# Patient Record
Sex: Male | Born: 1960 | Race: White | Hispanic: No | Marital: Married | Smoking: Never smoker
Health system: Southern US, Community
[De-identification: ages and names within clinical notes are randomized; demographics above are authoritative.]

## PROBLEM LIST (undated history)

## (undated) DIAGNOSIS — G4730 Sleep apnea, unspecified: Secondary | ICD-10-CM

## (undated) DIAGNOSIS — R0902 Hypoxemia: Secondary | ICD-10-CM

## (undated) DIAGNOSIS — K219 Gastro-esophageal reflux disease without esophagitis: Secondary | ICD-10-CM

## (undated) DIAGNOSIS — F419 Anxiety disorder, unspecified: Secondary | ICD-10-CM

## (undated) DIAGNOSIS — T7840XA Allergy, unspecified, initial encounter: Secondary | ICD-10-CM

## (undated) DIAGNOSIS — Z789 Other specified health status: Secondary | ICD-10-CM

## (undated) HISTORY — DX: Hypoxemia: R09.02

## (undated) HISTORY — PX: FRACTURE SURGERY: SHX138

## (undated) HISTORY — DX: Allergy, unspecified, initial encounter: T78.40XA

## (undated) HISTORY — DX: Anxiety disorder, unspecified: F41.9

## (undated) HISTORY — DX: Sleep apnea, unspecified: G47.30

## (undated) HISTORY — DX: Gastro-esophageal reflux disease without esophagitis: K21.9

## (undated) HISTORY — DX: Other specified health status: Z78.9

---

## 1992-06-18 HISTORY — PX: KNEE ARTHROSCOPY: SUR90

## 2010-02-13 ENCOUNTER — Emergency Department: Payer: Self-pay | Admitting: Emergency Medicine

## 2011-06-14 ENCOUNTER — Emergency Department: Payer: Self-pay | Admitting: *Deleted

## 2011-06-14 ENCOUNTER — Ambulatory Visit: Payer: Self-pay

## 2013-12-23 ENCOUNTER — Observation Stay: Payer: Self-pay | Admitting: Internal Medicine

## 2013-12-23 LAB — CK TOTAL AND CKMB (NOT AT ARMC)
CK, TOTAL: 83 U/L
CK, Total: 126 U/L
CK, Total: 74 U/L
CK-MB: 3.8 ng/mL — ABNORMAL HIGH (ref 0.5–3.6)
CK-MB: 2.7 ng/mL (ref 0.5–3.6)
CK-MB: 2.4 ng/mL (ref 0.5–3.6)

## 2013-12-23 LAB — CBC
HCT: 45.4 % (ref 40.0–52.0)
HGB: 15.3 g/dL (ref 13.0–18.0)
MCH: 33 pg (ref 26.0–34.0)
MCHC: 33.7 g/dL (ref 32.0–36.0)
MCV: 98 fL (ref 80–100)
Platelet: 233 10*3/uL (ref 150–440)
RBC: 4.65 10*6/uL (ref 4.40–5.90)
RDW: 12.8 % (ref 11.5–14.5)
WBC: 5 10*3/uL (ref 3.8–10.6)

## 2013-12-23 LAB — LIPID PANEL
Cholesterol: 179 mg/dL (ref 0–200)
HDL Cholesterol: 52 mg/dL (ref 40–60)
Ldl Cholesterol, Calc: 111 mg/dL — ABNORMAL HIGH (ref 0–100)
TRIGLYCERIDES: 79 mg/dL (ref 0–200)
VLDL CHOLESTEROL, CALC: 16 mg/dL (ref 5–40)

## 2013-12-23 LAB — BASIC METABOLIC PANEL
ANION GAP: 10 (ref 7–16)
BUN: 10 mg/dL (ref 7–18)
CALCIUM: 8.7 mg/dL (ref 8.5–10.1)
CO2: 25 mmol/L (ref 21–32)
Chloride: 106 mmol/L (ref 98–107)
Creatinine: 0.91 mg/dL (ref 0.60–1.30)
EGFR (African American): >60
EGFR (Non-African Amer.): >60
GLUCOSE: 113 mg/dL — AB (ref 65–99)
OSMOLALITY: 281 (ref 275–301)
Potassium: 4.1 mmol/L (ref 3.5–5.1)
SODIUM: 141 mmol/L (ref 136–145)

## 2013-12-23 LAB — TROPONIN I: Troponin-I: <0.02 ng/mL

## 2013-12-24 LAB — BASIC METABOLIC PANEL
Anion Gap: 8 (ref 7–16)
BUN: 11 mg/dL (ref 7–18)
CALCIUM: 8.3 mg/dL — AB (ref 8.5–10.1)
CHLORIDE: 107 mmol/L (ref 98–107)
CREATININE: 0.99 mg/dL (ref 0.60–1.30)
Co2: 26 mmol/L (ref 21–32)
EGFR (African American): >60
EGFR (Non-African Amer.): >60
GLUCOSE: 104 mg/dL — AB (ref 65–99)
Osmolality: 281 (ref 275–301)
POTASSIUM: 3.8 mmol/L (ref 3.5–5.1)
Sodium: 141 mmol/L (ref 136–145)

## 2013-12-24 LAB — CBC WITH DIFFERENTIAL/PLATELET
Basophil #: 0 10*3/uL (ref 0.0–0.1)
Basophil %: 1 %
Eosinophil #: 0.1 10*3/uL (ref 0.0–0.7)
Eosinophil %: 2.9 %
HCT: 42.1 % (ref 40.0–52.0)
HGB: 14.4 g/dL (ref 13.0–18.0)
LYMPHS ABS: 1.5 10*3/uL (ref 1.0–3.6)
LYMPHS PCT: 33.7 %
MCH: 33.6 pg (ref 26.0–34.0)
MCHC: 34.2 g/dL (ref 32.0–36.0)
MCV: 99 fL (ref 80–100)
Monocyte #: 0.3 x10 3/mm (ref 0.2–1.0)
Monocyte %: 7.6 %
NEUTROS PCT: 54.8 %
Neutrophil #: 2.5 10*3/uL (ref 1.4–6.5)
PLATELETS: 217 10*3/uL (ref 150–440)
RBC: 4.27 10*6/uL — ABNORMAL LOW (ref 4.40–5.90)
RDW: 12.9 % (ref 11.5–14.5)
WBC: 4.5 10*3/uL (ref 3.8–10.6)

## 2014-10-09 NOTE — H&P (Signed)
PATIENT NAME:  Eric Mclaughlin, Eric Mclaughlin MR#:  [id number] DATE OF BIRTH:  1961-03-24  DATE OF ADMISSION:  12/23/2013  PRIMARY CARE PHYSICIAN: None.   ADMITTING PHYSICIAN:  Enid Baasadhika Ksean Vale, MD.  CHIEF COMPLAINT: Chest pain.   HISTORY OF PRESENT ILLNESS: Eric Mclaughlin is a 54 year old Caucasian male with no significant past medical history who presents to the hospital complaining of chest pain. 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 He has no prior cardiac history, never seen a physician or been on any medications. He has two brothers, both of them diagnosed with coronary artery disease and both have stents. The patient is not a smoker. He presented to the Emergency Room, still had chest pressure, it improved a little bit with nitroglycerin and aspirin. He feels extremely lightheaded but no flushing, diaphoresis or nausea at this time.   PAST MEDICAL HISTORY:  Gastroesophageal reflux disease.   PAST SURGICAL HISTORY: Bilateral knee repairs for ligament tears. Reconstruction of the left side of the face after a baseball injury in the past.   ALLERGIES: No known drug allergies.   HOME MEDICATIONS:  Prilosec 20 mg over the counter daily.   SOCIAL HISTORY: Lives at home, no smoking, works in Nature conservation officerthe construction business study. He drinks at least 1 to 2 glasses of beer or mixed drinks every night.   FAMILY HISTORY:  Two brothers both of whom have heart disease in their 1450s.   DISCHARGE ACTIVITY: Patient obstruction of the left side of the face after a baseball injury in the past.   REVIEW OF SYSTEMS: CONSTITUTIONAL: No fever, fatigue, or weakness.  EYES: No blurred vision, double vision, inflammation or glaucoma.  ENT: No tinnitus, ear pain,  hearing loss, epistaxis or discharge.  RESPIRATORY: No cough, wheeze, hemoptysis or COPD. CARDIOVASCULAR: Positive for chest pain, orthopnea, edema, arrhythmia, palpitations, or syncope.  GASTROINTESTINAL: No nausea, vomiting, diarrhea, abdominal pain or melena.  GENITOURINARY: No dysuria, renal calculus, frequency, or incontinence.  ENDOCRINE: No polyuria, nocturia, thyroid problems, heat or cold intolerance.  HEMATOLOGY: No anemia, easy bruising or bleeding.  SKIN: No acne, rash or lesions.  MUSCULOSKELETAL: No neck, back, or shoulder pain, no arthritis or gout.  NEUROLOGIC: No numbness, weakness, CVA, transient ischemic attack or seizures.  PSYCHOLOGICAL: No anxiety, insomnia or depression.   PHYSICAL EXAMINATION:  VITAL SIGNS: Temperature 98.6 degrees Fahrenheit, pulse 73, respirations 22, blood pressure 158/87, pulse oximetry 100% on room air.  GENERAL: Well-built, well-nourished male lying in bed, not in any acute distress.  HEENT: Normocephalic, atraumatic. Pupils equal, round, reacting to light. Anicteric sclerae. Extraocular movements intact. Oropharynx clear without erythema, mass or exudates.  NECK: Supple. No thyromegaly, JVD or carotid bruits.  LUNGS: Clear to auscultation bilaterally. No wheeze or crackles. No signs of accessory muscles for breathing.  CARDIOVASCULAR: S1, S2, regular rate and rhythm. No murmurs, rubs, or gallops.  ABDOMEN: Soft, nontender, nondistended. No hepatosplenomegaly. Normal bowel sounds.  EXTREMITIES: No pedal edema. No clubbing or no cyanosis. Posterior tibial and dorsalis pedis pulses palpable bilaterally.  SKIN:  No rash or lesions.   LYMPHATIC AND HEMATOLOGIC:  No inguinal lymphadenopathy.  NEUROLOGIC: Cranial nerves intact. No focal motor or sensory deficits.  PSYCHOLOGICAL: The patient is awake, alert, and oriented  x 3.   LABORATORY DATA:  WBC 5.0, hemoglobin 15.3, hematocrit 45.4, platelet count 233,000. Sodium 141, potassium 4.1, chloride 106,  bicarbonate 25, BUN 10, creatinine 0.9, glucose 113, calcium 8.7, troponin less than 0.02.   CT of the chest showing coronary artery disease. No evidence of pulmonary embolus. Thoracic aorta unremarkable.   EKG showing normal sinus rhythm. No acute ST-T wave abnormalities.   ASSESSMENT AND PLAN: A 54 year old male with no past medical history, admitted for chest pain.  1.  Chest pain.  Typical for unstable angina.  2.  CT chest showing coronary artery disease.  Will admit to telemetry, order myocardial infarction review, recycle troponins. Get cardiology consult. Nitropaste, aspirin. Check lipid profile for risk stratification. 3.  Gastroesophageal reflux disease. Continue PPI at this time.   CODE STATUS: Full code.   Time spent on admission:  50 minutes.    ____________________________ Enid Baas, MD rk:lt Mclaughlin: 12/23/2013 14:43:02 ET T: 12/23/2013 15:01:46 ET JOB#: [id number]  cc: Enid Baas, MD, <Dictator> Enid Baas MD ELECTRONICALLY SIGNED 12/24/2013 10:48

## 2014-10-09 NOTE — Consult Note (Signed)
PATIENT NAME:  Eric Mclaughlin, Eric Mclaughlin MR#:  [id number] DATE OF BIRTH:  24-Oct-1960  DATE OF CONSULTATION:  12/24/2013  REFERRING PHYSICIAN:  Dr. Nemiah CommanderKalisetti. CONSULTING PHYSICIAN:  Jermarcus Mcfadyen Mclaughlin. Juliann Paresallwood, MD  PRIMARY CARE PHYSICIAN: Dr. Dossie Arbourrissman.   INDICATION: Chest pain.   HISTORY OF PRESENT ILLNESS: The patient is a 54 year old white male with significant past history in the hospital with chest pain. 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  The pain became a pressure sensation radiating to between the shoulder blades, mostly on the left side. He has no prior cardiac history. He just had some reflux symptoms. He has had significant family history of coronary artery disease. He is a nonsmoker, but with this current symptom, he came to the Emergency Room for evaluation, assessed and was advised to be admitted.   PAST MEDICAL HISTORY: GERD, hypertension, and borderline obesity.   PAST SURGICAL HISTORY: Bilateral knee repair/reconstruction  of the knee and left face injury.   ALLERGIES: NONE.   HOME MEDICATIONS: Prilosec 20 mg a day p.r.n.   SOCIAL HISTORY: Lives at home. Denies smoking.  Works in Holiday representativeconstruction and occasionally drinks.   FAMILY HISTORY: Two brothers with significant coronary artery disease, status post PCI and stent.   REVIEW OF SYSTEMS:  No blackout spells or syncope. No nausea or vomiting. No fever, no chills, no sweats, no weight loss, no weight gain, no hemoptysis or hematemesis. No bright red blood per rectum. No vision change or hearing change. Denies sputum production or cough.   PHYSICAL EXAMINATION:  VITAL SIGNS: Blood pressure 150/80, pulse 70, respirations 16, afebrile.  HEENT: Normocephalic, atraumatic. Pupils equal and reactive to light.   NECK:  Supple. No significant JVD, bruits, or adenopathy.  LUNGS: Clear to auscultation. No significant wheeze, rhonchi, or rale.   HEART: Regular rate and rhythm. No significant murmur, gallops or rubs.  ABDOMEN: Benign.  EXTREMITY: Within normal limits.  NEUROLOGIC: Intact.  SKIN: Normal.   LABORATORY AND DIAGNOSTICS: White count is 5, hemoglobin 15, hematocrit 45, platelet count of  233,000, sodium 141, potassium 4.1, chloride of 106, bicarbonate of 25, BUN 10, creatinine 0.9, glucose 113, calcium 8.7. Troponin less than 0.02. CT of the chest was negative.   EKG: Normal sinus rhythm, nonspecific ST-T changes.   ASSESSMENT:  1.  Chest pain, possible angina.  Evaluate for coronary artery disease.  CT of the chest shows significant coronary artery calcification.   2.  Reflux. 3.  Borderline obesity. 4.  Significant family history.   PLAN:  1.  Agree with admit. Rule out for myocardial infarction. Follow up EKGs. Follow up cardiac enzymes. Would also consider functional study versus echocardiogram. Do not perceive recommend a cardiac catheterization right now.   2.  Continue omeprazole therapy for GERD.  Continue aspirin therapy for possible primary prevention. Have the patient follow up as necessary for further evaluation and management. Recommend weight loss and exercise portion control.  Would recommend lipid evaluation as well.    ____________________________ Bobbie Stackwayne Mclaughlin. Juliann Paresallwood, MD ddc:ts Mclaughlin: 12/24/2013 14:59:59 ET T: 12/24/2013 15:56:05 ET JOB#: [id number]  cc: Chinonso Linker Mclaughlin. Juliann Paresallwood, MD, <Dictator> Alwyn PeaWAYNE Mclaughlin Edyth Glomb MD ELECTRONICALLY SIGNED 01/29/2014 12:30

## 2014-10-09 NOTE — Discharge Summary (Signed)
PATIENT NAME:  Eric Mclaughlin, Eric D MR#:  [id number] DATE OF BIRTH:  1961-01-17  DATE OF ADMISSION:  12/23/2013 DATE OF DISCHARGE:  12/24/2013  ADMITTING DIAGNOSIS: Chest pain.   DISCHARGE DIAGNOSES: 1.  Chest pain, felt to be noncardiac, status post stress MIBI which was negative for ischemia or infarct; however, his CT scan of the chest did show coronary calcification suggestive of coronary artery disease. He was seen in consultation by cardiology, Dr. Juliann Paresallwood, who recommended outpatient follow-up.  2.  Gastroesophageal reflux disease.   PERTINENT LABS AND EVALUATIONS: EKG on admission showed normal sinus rhythm without any ST-T wave changes.   CK-MB was 3.8, CPK was 126. Cholesterol 179, triglycerides 79, HDL 52, and LDL was 111. Glucose 113, BUN 10, creatinine 0.91, sodium 141, potassium 4.1, chloride 106, CO2 25, calcium 8.7. WBC 5, hemoglobin 15.3, platelet count 223,000.   CT per PE protocol showed no evidence of PE. Coronary artery calcifications were noted.   His myocardial perfusion scan showed no significant wall motion abnormality, estimated EF 65%. Left ventricular global function was normal. No EKG changes. No artifact noted.   HOSPITAL COURSE: Please refer to H and P done by the admitting physician. The patient is a 54 year old white male with history of reflux, presented with complaint of chest pain and left arm pain. 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 Due to these symptoms, he came to the ED. His cardiac enzymes were negative. EKG nonrevealing. However, he did have a CT per PE protocol which showed some coronary calcifications. He was admitted to the hospital under observation. Serial cardiac enzymes were done, which remained negative. His chest pain resolved after hospitalization. He was seen in consultation by cardiology and underwent a stress MIBI which showed no evidence of ischemia or infarct. Cardiology recommended outpatient followup.  At this time, the patient is asymptomatic. If he has recurrence of his symptoms, he should undergo cardiac cath.  DISCHARGE MEDICATIONS: Omeprazole 20 daily, Tylenol 650 q. 4 p.r.n., aspirin 325 daily.   DISCHARGE DIET: Low fat, low-cholesterol.   DISCHARGE ACTIVITY: As tolerated.   DISCHARGE FOLLOWUP: Follow with primary MD. He needs to establish a primary care MD. Follow with Dr. Juliann Paresallwood in 2 to 4 weeks.  TIME SPENT: 35 minutes.   ____________________________ Lacie ScottsShreyang H. Allena KatzPatel, MD shp:sb D: 12/25/2013 08:12:20 ET T: 12/25/2013 09:46:45 ET JOB#: [id number]  cc: Mallissa Lorenzen H. Allena KatzPatel, MD, <Dictator> Charise CarwinSHREYANG H Malai Lady MD ELECTRONICALLY SIGNED 12/30/2013 8:43

## 2016-03-21 ENCOUNTER — Encounter: Payer: Self-pay | Admitting: Physician Assistant

## 2016-03-21 ENCOUNTER — Ambulatory Visit: Payer: Self-pay | Admitting: Physician Assistant

## 2016-03-21 VITALS — BP 128/80 | HR 88 | Temp 98.5°F

## 2016-03-21 DIAGNOSIS — J0100 Acute maxillary sinusitis, unspecified: Secondary | ICD-10-CM

## 2016-03-21 DIAGNOSIS — H1031 Unspecified acute conjunctivitis, right eye: Secondary | ICD-10-CM

## 2016-03-21 MED ORDER — TOBRAMYCIN 0.3 % OP SOLN: 2.0000 [drp] | OPHTHALMIC | 0 refills | Status: DC

## 2016-03-21 MED ORDER — FLUTICASONE PROPIONATE 50 MCG/ACT NA SUSP: 2.0000 | Freq: Every day | NASAL | 6 refills | Status: DC

## 2016-03-21 MED ORDER — AMOXICILLIN 875 MG PO TABS: 875.0000 mg | ORAL_TABLET | Freq: Two times a day (BID) | ORAL | 0 refills | Status: DC

## 2016-03-21 NOTE — Progress Notes (Signed)
S: C/o runny nose and congestion for 3 days, no fever, chills, cp/sob, v/d; mucus is green and thick, cough is sporadic, c/o of facial and dental pain. Also r eye is matted and draining, daughter had same sx a week or so ago  Using otc meds:   O: PE: vitals wnl, nad, perrl eomi, conjunctiva injected with yellow drainage from r eye; normocephalic, tms dull, nasal mucosa red and swollen, throat injected, neck supple no lymph, lungs c t a, cv rrr, neuro intact  A:  Acute sinusitis, acute conjunctivitis   P: drink fluids, continue regular meds , use otc meds of choice, return if not improving in 5 days, return earlier if worsening , amoxil, tobramycin opth drops, flonase

## 2017-04-15 ENCOUNTER — Emergency Department
Admission: EM | Admit: 2017-04-15 | Discharge: 2017-04-15 | Disposition: A | Discharge status: Home / Self Care | Payer: Managed Care, Other (non HMO) | Attending: Emergency Medicine | Admitting: Emergency Medicine

## 2017-04-15 ENCOUNTER — Encounter: Payer: Self-pay | Admitting: *Deleted

## 2017-04-15 DIAGNOSIS — I10 Essential (primary) hypertension: Secondary | ICD-10-CM | POA: Diagnosis not present

## 2017-04-15 DIAGNOSIS — T7840XA Allergy, unspecified, initial encounter: Secondary | ICD-10-CM | POA: Diagnosis present

## 2017-04-15 DIAGNOSIS — Y9389 Activity, other specified: Secondary | ICD-10-CM | POA: Insufficient documentation

## 2017-04-15 DIAGNOSIS — X58XXXA Exposure to other specified factors, initial encounter: Secondary | ICD-10-CM | POA: Diagnosis not present

## 2017-04-15 DIAGNOSIS — Z79899 Other long term (current) drug therapy: Secondary | ICD-10-CM | POA: Insufficient documentation

## 2017-04-15 DIAGNOSIS — T63441A Toxic effect of venom of bees, accidental (unintentional), initial encounter: Secondary | ICD-10-CM | POA: Diagnosis not present

## 2017-04-15 DIAGNOSIS — Y929 Unspecified place or not applicable: Secondary | ICD-10-CM | POA: Insufficient documentation

## 2017-04-15 MED ORDER — HYDROXYZINE HCL 50 MG PO TABS: 50.0000 mg | ORAL_TABLET | Freq: Three times a day (TID) | ORAL | 0 refills | Status: DC | PRN

## 2017-04-15 MED ORDER — METHYLPREDNISOLONE SODIUM SUCC 125 MG IJ SOLR
80.0000 mg | Freq: Once | INTRAMUSCULAR | Status: AC
  Administered 2017-04-15: 80 mg via INTRAMUSCULAR
  Filled 2017-04-15: qty 2

## 2017-04-15 MED ORDER — HYDROXYZINE HCL 50 MG PO TABS
50.0000 mg | ORAL_TABLET | Freq: Once | ORAL | Status: AC
  Administered 2017-04-15: 50 mg via ORAL
  Filled 2017-04-15: qty 1

## 2017-04-15 MED ORDER — METHYLPREDNISOLONE 4 MG PO TBPK: ORAL_TABLET | ORAL | 0 refills | Status: DC

## 2017-04-15 NOTE — ED Notes (Signed)
First Nurse: got stung by a wasp PTA and is allergic to same. No resp distress at this time.

## 2017-04-15 NOTE — ED Provider Notes (Signed)
[facility] Emergency Department Provider Note   ____________________________________________   First MD Initiated Contact with Patient 04/15/17 1300     (approximate)  I have reviewed the triage vital signs and the nursing notes.   HISTORY  Chief Complaint Allergic Reaction    HPI Rochele Raringerry D Lucier is a 56 y.o. male patient presents with pain and itching to the left shoulder and posterior neck secondary to 2 wasp stings. Patient denies any other anaphylactics signs and symptoms. Patient had a similar incident for couple years ago resulting in swelling of the wrist. It was noted patient blood pressure is elevated file history hypertension. Incident occurred less than an hour ago.  History reviewed. No pertinent past medical history.  There are no active problems to display for this patient.   History reviewed. No pertinent surgical history.  Prior to Admission medications   Medication Sig Start Date End Date Taking? Authorizing Provider  amoxicillin (AMOXIL) 875 MG tablet Take 1 tablet (875 mg total) by mouth 2 (two) times daily. 03/21/16   Fisher, Roselyn BeringSusan W, PA-C  fluticasone (FLONASE) 50 MCG/ACT nasal spray Place 2 sprays into both nostrils daily. 03/21/16   Fisher, Roselyn BeringSusan W, PA-C  hydrOXYzine (ATARAX/VISTARIL) 50 MG tablet Take 1 tablet (50 mg total) by mouth 3 (three) times daily as needed for itching. 04/15/17   Joni ReiningSmith, Savonna Birchmeier K, PA-C  methylPREDNISolone (MEDROL DOSEPAK) 4 MG TBPK tablet Take Tapered dose as directed 04/15/17   Joni ReiningSmith, Elianie Hubers K, PA-C  tobramycin (TOBREX) 0.3 % ophthalmic solution Place 2 drops into both eyes every 4 (four) hours. While awake for 5 days 03/21/16   Sherrie MustacheFisher, Roselyn BeringSusan W, PA-C    Allergies Wasp venom  History reviewed. No pertinent family history.  Social History Social History  Substance Use Topics  . Smoking status: Never Smoker  . Smokeless tobacco: Never Used  . Alcohol use Not on file    Review of  Systems Constitutional: No fever/chills Eyes: No visual changes. ENT: No sore throat. Cardiovascular: Denies chest pain. Respiratory: Denies shortness of breath. Gastrointestinal: No abdominal pain.  No nausea, no vomiting.  No diarrhea.  No constipation. Genitourinary: Negative for dysuria. Musculoskeletal: Negative for back pain. Skin: Redness to the posterior neck and posterior shoulder Neurological: Negative for headaches, focal weakness or numbness. Allergic/Immunilogical:Wasp____________________________________________   PHYSICAL EXAM:  VITAL SIGNS: ED Triage Vitals  Enc Vitals Group     BP 04/15/17 1240 (!) 161/100     Pulse Rate 04/15/17 1240 85     Resp 04/15/17 1240 18     Temp 04/15/17 1240 97.6 F (36.4 C)     Temp Source 04/15/17 1240 Oral     SpO2 04/15/17 1240 100 %     Weight 04/15/17 1241 240 lb (108.9 kg)     Height 04/15/17 1241 5\' 10"  (1.778 m)     Head Circumference --      Peak Flow --      Pain Score --      Pain Loc --      Pain Edu? --      Excl. in GC? --    Constitutional: Alert and oriented. Well appearing and in no acute distress. Cardiovascular: Normal rate, regular rhythm. Grossly normal heart sounds.  Good peripheral circulation. Respiratory: Normal respiratory effort.  No retractions. Lungs CTAB. Gastrointestinal: Soft and nontender. No distention. No abdominal bruits. No CVA tenderness. Neurologic:  Normal speech and language. No gross focal neurologic deficits are appreciated. No gait instability. Skin:  Skin is warm, dry and intact. Mild erythema to the posterior neck and posterior left upper shoulder. Psychiatric: Mood and affect are normal. Speech and behavior are normal.  ____________________________________________   LABS (all labs ordered are listed, but only abnormal results are displayed)  Labs Reviewed - No data to  display ____________________________________________  EKG   ____________________________________________  RADIOLOGY  No results found.  ____________________________________________   PROCEDURES  Procedure(s) performed: None  Procedures  Critical Care performed: No  ____________________________________________   INITIAL IMPRESSION / ASSESSMENT AND PLAN / ED COURSE  As part of my medical decision making, I reviewed the following data within the electronic MEDICAL RECORD NUMBER    Localized reaction to bee sting. Patient given discharge care instructions. Patient advised to take medication as directed. Return to ED if condition worsens.      ____________________________________________   FINAL CLINICAL IMPRESSION(S) / ED DIAGNOSES  Final diagnoses:  Bee sting reaction, accidental or unintentional, initial encounter      NEW MEDICATIONS STARTED DURING THIS VISIT:  New Prescriptions   HYDROXYZINE (ATARAX/VISTARIL) 50 MG TABLET    Take 1 tablet (50 mg total) by mouth 3 (three) times daily as needed for itching.   METHYLPREDNISOLONE (MEDROL DOSEPAK) 4 MG TBPK TABLET    Take Tapered dose as directed     Note:  This document was prepared using Dragon voice recognition software and may include unintentional dictation errors.    Joni Reining, PA-C 04/15/17 1312    Governor Rooks, MD 04/15/17 [phone]

## 2017-04-15 NOTE — ED Triage Notes (Signed)
Pt arrives after a wasp stung him on the back of his neck and left shoulder, states hx of being stung in his left hand and had swelling, pt did not take anything pta, pt in no resp distress, pt has no hives visable

## 2017-04-15 NOTE — ED Notes (Signed)
Pt states about 12p was stung by wasp to posterior head and L shoulder. Red and swelling noted to both areas. Talking in clear sentences. No respiratory distress noted. Alert, oriented, ambulatory. Has not taken any medications for sting.

## 2017-07-04 ENCOUNTER — Encounter (INDEPENDENT_AMBULATORY_CARE_PROVIDER_SITE_OTHER): Payer: Self-pay

## 2017-07-04 ENCOUNTER — Ambulatory Visit: Payer: Self-pay | Admitting: Medical

## 2017-07-04 ENCOUNTER — Encounter: Payer: Self-pay | Admitting: Medical

## 2017-07-04 VITALS — BP 110/70 | HR 93 | Temp 98.4°F | Resp 16 | Ht 71.0 in | Wt 250.0 lb

## 2017-07-04 DIAGNOSIS — Z0000 Encounter for general adult medical examination without abnormal findings: Secondary | ICD-10-CM

## 2017-07-04 DIAGNOSIS — Z789 Other specified health status: Secondary | ICD-10-CM

## 2017-07-04 HISTORY — DX: Other specified health status: Z78.9

## 2017-07-04 NOTE — Patient Instructions (Signed)
Breast Self-Awareness Breast self-awareness means:  Knowing how your breasts look.  Knowing how your breasts feel.  Checking your breasts every month for changes.  Telling your doctor if you notice a change in your breasts.  Breast self-awareness allows you to notice a breast problem early while it is still small. How to do a breast self-exam One way to learn what is normal for your breasts and to check for changes is to do a breast self-exam. To do a breast self-exam: Look for Changes  1. Take off all the clothes above your waist. 2. Stand in front of a mirror in a room with good lighting. 3. Put your hands on your hips. 4. Push your hands down. 5. Look at your breasts and nipples in the mirror to see if one breast or nipple looks different than the other. Check to see if: ? The shape of one breast is different. ? The size of one breast is different. ? There are wrinkles, dips, and bumps in one breast and not the other. 6. Look at each breast for changes in your skin, such as: ? Redness. ? Scaly areas. 7. Look for changes in your nipples, such as: ? Liquid around the nipples. ? Bleeding. ? Dimpling. ? Redness. ? A change in where the nipples are. Feel for Changes 1. Lie on your back on the floor. 2. Feel each breast. To do this, follow these steps: ? Pick a breast to feel. ? Put the arm closest to that breast above your head. ? Use your other arm to feel the nipple area of your breast. Feel the area with the pads of your three middle fingers by making small circles with your fingers. For the first circle, press lightly. For the second circle, press harder. For the third circle, press even harder. ? Keep making circles with your fingers at the light, harder, and even harder pressures as you move down your breast. Stop when you feel your ribs. ? Move your fingers a little toward the center of your body. ? Start making circles with your fingers again, this time going up until  you reach your collarbone. ? Keep making up and down circles until you reach your armpit. Remember to keep using the three pressures. ? Feel the other breast in the same way. 3. Sit or stand in the shower or tub. 4. With soapy water on your skin, feel each breast the same way you did in step 2, when you were lying on the floor. Write Down What You Find  After doing the self-exam, write down:  What is normal for each breast.  Any changes you find in each breast.  When you last had your period.  How often should I check my breasts? Check your breasts every month. If you are breastfeeding, the best time to check them is after you feed your baby or after you use a breast pump. If you get periods, the best time to check your breasts is 5-7 days after your period is over. When should I see my doctor? See your doctor if you notice:  A change in shape or size of your breasts or nipples.  A change in the skin of your breast or nipples, such as red or scaly skin.  Unusual fluid coming from your nipples.  A lump or thick area that was not there before.  Pain in your breasts.  Anything that concerns you.  This information is not intended to replace advice given to   you by your health care provider. Make sure you discuss any questions you have with your health care provider. Document Released: 11/21/2007 Document Revised: 11/10/2015 Document Reviewed: 04/24/2015 Elsevier Interactive Patient Education  2018 [employer] Express Scriptsutoexamen testicular (Testicular Self-Exam) El autoexamen de los testculos consiste en la observacin y palpacin de los testculos para buscar ndulos anormales o hinchazn. Hay varios factores que pueden causar hinchazn, bultos o dolor en los testculos. Entre ellos se incluyen:  Lesiones.  Hinchazn, enrojecimiento y dolor (inflamacin).  Infeccin.  Lquido alrededor Visteon Corporationde los testculos.  Torsin testicular.  Cncer de testculo. Los testculos son ms  fciles de examinar despus de un bao o una ducha calientes. Es ms difcil examinarlos cuando tiene fro. Siga estos pasos mientras est de pie:  Sostenga el pene separado del cuerpo.  Haga rodar el testculo entre el pulgar y el ndice. Palpe todo el testculo.  Haga rodar el otro testculo entre el pulgar y el ndice. Palpe todo el testculo. Sienta la presencia de bultos, hinchazn o molestias. Un testculo normal tiene la forma de un huevo. Se siente firme. Es blando y no duele. El cordn espermtico se siente como una cuerda o espagueti. Se encuentra en la parte posterior del testculo. Examine el pliegue que se encuentra entre el frente de la pierna y el abdomen. Palpe si hay bultos que le duelen. Podra tratarse de ganglios linfticos agrandados. Esta informacin no tiene Theme park managercomo fin reemplazar el consejo del mdico. Asegrese de hacerle al mdico cualquier pregunta que tenga. Document Released: 07/07/2010 Document Revised: 03/25/2013 Document Reviewed: 11/24/2012 Elsevier Interactive Patient Education  2017 [employer]

## 2017-07-04 NOTE — Progress Notes (Signed)
Subjective:    Patient ID: Eric Mclaughlin, male    DOB: 31-Jul-1960, 57 y.o.   MRN: [id number]  HPI 57 yo male non acute distress  wanting physical because his wife was sick last week and wanted to be checked out. He works as a Comptroller. Does not get up in the middle of the night to urinate. Other concerns listed below in ROS.   PMHx none Surgical Hx  Knee surgery [phone] Nuclear stress test 2013 fine per patient. Echo in 2001 fine per patient.   Social married  Children 2 girls  52, 85 yo. Never smoked etoh  1-2 beers nightly or liquior  Influenza 01/2017 Tetanus in [facility] in 2013.   Review of Systems Wanted to be check for hemorrhoids occasional wiping with blood on tissue   this year.Last BM with blood on wiping BRB 2 months ago.  As has complained of pain between rectum and penis , he assures me he only has sexual intercourse with his wife , and she with him.  He has had to use "jock itch cream before and that seemed to help it" and resolved any symptoms for now. No symptoms today. Denies penis or testicular pain, and no discharge from penis, no urinary symptoms. Also concerned about weight gain does  1-2 beers / week and drings 4 Dr. Alcus Dad day.    Objective:   Physical Exam  Constitutional: He is oriented to person, place, and time. He appears well-developed and well-nourished.  HENT:  Head: Normocephalic and atraumatic.  Right Ear: External ear normal.  Left Ear: External ear normal.  Nose: Nose normal.  Mouth/Throat: Oropharynx is clear and moist.  Eyes: Conjunctivae, EOM and lids are normal. Pupils are equal, round, and reactive to light.  Fundoscopic exam:      The right eye shows red reflex.       The left eye shows red reflex.  Neck: Normal range of motion. Neck supple. No thyromegaly present.  Cardiovascular: Normal rate, regular rhythm, normal heart sounds and intact distal pulses.  Pulmonary/Chest: Effort normal and breath sounds  normal. No respiratory distress. He has no wheezes. He has no rales.  Abdominal: Soft. Bowel sounds are normal. He exhibits no distension and no mass. There is no hepatosplenomegaly. There is no tenderness. There is no rebound and no guarding.  Genitourinary: Penis normal.  Musculoskeletal: Normal range of motion.  Lymphadenopathy:    He has no cervical adenopathy.  Neurological: He is alert and oriented to person, place, and time. He has normal strength. No cranial nerve deficit. He displays a negative Romberg sign. Coordination normal. GCS eye subscore is 4. GCS verbal subscore is 5. GCS motor subscore is 6.  Reflex Scores:      Patellar reflexes are 2+ on the right side and 2+ on the left side. Skin: Skin is warm, dry and intact.  Psychiatric: He has a normal mood and affect. His speech is normal and behavior is normal. Judgment and thought content normal. Cognition and memory are normal.  Nursing note and vitals reviewed.   Palpable enlarged smooth prostate. Rectal good tone , small hemorrhoid at 12 0'Clock not inflammed. obeses    Assessment & Plan:  Physical exam  Enlarged Prostate will check PSA, labs pending. Declines Hep C or HIV or STD testing. He says " no need to". Explained CDC recommendation and again he does not want testing. Hemmorrhoid rectum not inflammed. Weight Gain decrease beer and stop regular  soft drinks switch to diet. Needs to be scheduled for Colonoscopy. Discussed with patient to follow up with clinic when he has other symptoms, verbalizes understanding and  has no questions at discharge.

## 2017-07-05 ENCOUNTER — Other Ambulatory Visit: Payer: Self-pay | Admitting: Medical

## 2017-07-05 LAB — CMP12+LP+TP+TSH+6AC+PSA+CBC…
ALT: 23 IU/L (ref 0–44)
AST: 19 IU/L (ref 0–40)
Albumin/Globulin Ratio: 1.8 (ref 1.2–2.2)
Albumin: 4.8 g/dL (ref 3.5–5.5)
Alkaline Phosphatase: 105 IU/L (ref 39–117)
BILIRUBIN TOTAL: 0.8 mg/dL (ref 0.0–1.2)
BUN / CREAT RATIO: 11 (ref 9–20)
BUN: 11 mg/dL (ref 6–24)
Basophils Absolute: 0 10*3/uL (ref 0.0–0.2)
Basos: 1 %
CHLORIDE: 104 mmol/L (ref 96–106)
Calcium: 9.7 mg/dL (ref 8.7–10.2)
Chol/HDL Ratio: 3.9 ratio (ref 0.0–5.0)
Cholesterol, Total: 200 mg/dL — ABNORMAL HIGH (ref 100–199)
Creatinine, Ser: 1.03 mg/dL (ref 0.76–1.27)
EOS (ABSOLUTE): 0.2 10*3/uL (ref 0.0–0.4)
EOS: 4 %
ESTIMATED CHD RISK: 0.7 times avg. (ref 0.0–1.0)
Free Thyroxine Index: 1.9 (ref 1.2–4.9)
GFR calc non Af Amer: 81 mL/min/{1.73_m2} (ref >59)
GFR, EST AFRICAN AMERICAN: 94 mL/min/{1.73_m2} (ref >59)
GGT: 29 IU/L (ref 0–65)
GLUCOSE: 95 mg/dL (ref 65–99)
Globulin, Total: 2.6 g/dL (ref 1.5–4.5)
HDL: 51 mg/dL (ref >39)
HEMOGLOBIN: 16.1 g/dL (ref 13.0–17.7)
Hematocrit: 46.6 % (ref 37.5–51.0)
IRON: 117 ug/dL (ref 38–169)
Immature Grans (Abs): 0 10*3/uL (ref 0.0–0.1)
Immature Granulocytes: 0 %
LDH: 163 IU/L (ref 121–224)
LDL Calculated: 121 mg/dL — ABNORMAL HIGH (ref 0–99)
Lymphocytes Absolute: 1.9 10*3/uL (ref 0.7–3.1)
Lymphs: 40 %
MCH: 33.7 pg — ABNORMAL HIGH (ref 26.6–33.0)
MCHC: 34.5 g/dL (ref 31.5–35.7)
MCV: 98 fL — AB (ref 79–97)
MONOCYTES: 7 %
Monocytes Absolute: 0.3 10*3/uL (ref 0.1–0.9)
NEUTROS ABS: 2.3 10*3/uL (ref 1.4–7.0)
Neutrophils: 48 %
PHOSPHORUS: 3.6 mg/dL (ref 2.5–4.5)
POTASSIUM: 4.4 mmol/L (ref 3.5–5.2)
Platelets: 257 10*3/uL (ref 150–379)
Prostate Specific Ag, Serum: 0.8 ng/mL (ref 0.0–4.0)
RBC: 4.78 x10E6/uL (ref 4.14–5.80)
RDW: 13 % (ref 12.3–15.4)
Sodium: 142 mmol/L (ref 134–144)
T3 UPTAKE RATIO: 29 % (ref 24–39)
T4, Total: 6.7 ug/dL (ref 4.5–12.0)
TOTAL PROTEIN: 7.4 g/dL (ref 6.0–8.5)
TSH: 1.57 u[IU]/mL (ref 0.450–4.500)
Triglycerides: 140 mg/dL (ref 0–149)
URIC ACID: 4.5 mg/dL (ref 3.7–8.6)
VLDL CHOLESTEROL CAL: 28 mg/dL (ref 5–40)
WBC: 4.7 10*3/uL (ref 3.4–10.8)

## 2017-07-05 LAB — VITAMIN D 25 HYDROXY (VIT D DEFICIENCY, FRACTURES): Vit D, 25-Hydroxy: 25 ng/mL — ABNORMAL LOW (ref 30.0–100.0)

## 2017-07-07 LAB — FECAL OCCULT BLOOD, IMMUNOCHEMICAL: Fecal Occult Bld: NEGATIVE

## 2017-07-10 NOTE — Progress Notes (Signed)
Please let patient know fecal sample was negative for blood. Ty

## 2017-09-23 ENCOUNTER — Ambulatory Visit: Payer: Self-pay | Admitting: Family Medicine

## 2017-09-23 ENCOUNTER — Encounter: Payer: Self-pay | Admitting: Family Medicine

## 2017-09-23 VITALS — BP 136/72 | HR 74 | Temp 97.6°F | Resp 16

## 2017-09-23 DIAGNOSIS — J3489 Other specified disorders of nose and nasal sinuses: Secondary | ICD-10-CM

## 2017-09-23 MED ORDER — LORATADINE 10 MG PO TABS: 10.0000 mg | ORAL_TABLET | Freq: Every day | ORAL | 0 refills | Status: DC | PRN

## 2017-09-23 NOTE — Progress Notes (Signed)
Subjective: Runny nose     Eric Mclaughlin is a 57 y.o. male who presents for evaluation of runny nose, fatigue, sneezing, sore throat and mild nonproductive cough for 4 days.  Patient reports that his sore throat has been improving.  Denies fever or chills. Treatment to date: Over-the-counter cold/flu medication.  Denies rash, nausea, vomiting, diarrhea, shortness of breath, wheezing, chest or back pain, ear pain, difficulty swallowing, confusion, purulent nasal discharge, facial pressure, headache, body aches, or severe symptoms.  Denies initial improvement and then worsening of symptoms. History of smoking, asthma, COPD: Negative. History of recurrent sinus and/or lung infections: Negative. Medical history: Patient denies.  Review of Systems Pertinent items noted in HPI and remainder of comprehensive ROS otherwise negative.     Objective:   Physical Exam General: Awake, alert, and oriented. No acute distress. Well developed, hydrated and nourished. Appears stated age. Nontoxic appearance.  HEENT:  PND noted.  Mild erythema to posterior oropharynx.  No edema or exudates of pharynx or tonsils. No erythema or bulging of TM.  Mild erythema/edema to nasal mucosa. Sinuses nontender. Supple neck without adenopathy. Cardiac: Heart rate and rhythm are normal. No murmurs, gallops, or rubs are auscultated. S1 and S2 are heard and are of normal intensity.  Blood pressure checked by manual measurement 136/72. Respiratory: No signs of respiratory distress. Lungs clear. No tachypnea. Able to speak in full sentences without dyspnea. Nonlabored respirations.  Skin: Skin is warm, dry and intact. Appropriate color for ethnicity. No cyanosis noted.   Diagnostic Results: None.  Assessment:    viral upper respiratory illness   Plan:    Discussed diagnosis and treatment of URI. Discussed the importance of avoiding unnecessary antibiotic therapy. Suggested symptomatic OTC remedies. Nasal saline spray for  congestion.   Follow-up with primary care provider if needed.  Advised patient to establish care with new primary care provider, as he does not currently have one. Discussed red flag symptoms and circumstances with which to seek medical care.   New Prescriptions   LORATADINE (CLARITIN) 10 MG TABLET    Take 1 tablet (10 mg total) by mouth daily as needed (runny nose).

## 2019-05-13 ENCOUNTER — Observation Stay
Admission: EM | Admit: 2019-05-13 | Discharge: 2019-05-14 | Disposition: A | Discharge status: Home / Self Care | Payer: Managed Care, Other (non HMO) | Attending: Internal Medicine | Admitting: Internal Medicine

## 2019-05-13 ENCOUNTER — Emergency Department: Payer: Managed Care, Other (non HMO)

## 2019-05-13 ENCOUNTER — Other Ambulatory Visit: Payer: Self-pay

## 2019-05-13 ENCOUNTER — Encounter: Payer: Self-pay | Admitting: Emergency Medicine

## 2019-05-13 DIAGNOSIS — R030 Elevated blood-pressure reading, without diagnosis of hypertension: Secondary | ICD-10-CM | POA: Diagnosis present

## 2019-05-13 DIAGNOSIS — R739 Hyperglycemia, unspecified: Secondary | ICD-10-CM | POA: Diagnosis not present

## 2019-05-13 DIAGNOSIS — Z20828 Contact with and (suspected) exposure to other viral communicable diseases: Secondary | ICD-10-CM | POA: Diagnosis not present

## 2019-05-13 DIAGNOSIS — R55 Syncope and collapse: Principal | ICD-10-CM | POA: Insufficient documentation

## 2019-05-13 DIAGNOSIS — R000 Tachycardia, unspecified: Secondary | ICD-10-CM | POA: Diagnosis not present

## 2019-05-13 DIAGNOSIS — R42 Dizziness and giddiness: Secondary | ICD-10-CM | POA: Diagnosis present

## 2019-05-13 DIAGNOSIS — I10 Essential (primary) hypertension: Secondary | ICD-10-CM | POA: Diagnosis present

## 2019-05-13 LAB — BASIC METABOLIC PANEL
Anion gap: 10 (ref 5–15)
BUN: 10 mg/dL (ref 6–20)
CO2: 25 mmol/L (ref 22–32)
Calcium: 9.5 mg/dL (ref 8.9–10.3)
Chloride: 103 mmol/L (ref 98–111)
Creatinine, Ser: 0.96 mg/dL (ref 0.61–1.24)
GFR calc Af Amer: >60 mL/min (ref >60)
GFR calc non Af Amer: >60 mL/min (ref >60)
Glucose, Bld: 135 mg/dL — ABNORMAL HIGH (ref 70–99)
Potassium: 3.7 mmol/L (ref 3.5–5.1)
Sodium: 138 mmol/L (ref 135–145)

## 2019-05-13 LAB — TROPONIN I (HIGH SENSITIVITY)
Troponin I (High Sensitivity): 3 ng/L (ref <18)
Troponin I (High Sensitivity): 5 ng/L (ref <18)

## 2019-05-13 LAB — CBC
HCT: 44.7 % (ref 39.0–52.0)
Hemoglobin: 15.9 g/dL (ref 13.0–17.0)
MCH: 34.2 pg — ABNORMAL HIGH (ref 26.0–34.0)
MCHC: 35.6 g/dL (ref 30.0–36.0)
MCV: 96.1 fL (ref 80.0–100.0)
Platelets: 244 10*3/uL (ref 150–400)
RBC: 4.65 MIL/uL (ref 4.22–5.81)
RDW: 12.1 % (ref 11.5–15.5)
WBC: 8.7 10*3/uL (ref 4.0–10.5)
nRBC: 0 % (ref 0.0–0.2)

## 2019-05-13 MED ORDER — IOHEXOL 350 MG/ML SOLN
100.0000 mL | Freq: Once | INTRAVENOUS | Status: AC | PRN
Start: 2019-05-13 — End: 2019-05-13
  Administered 2019-05-13: 100 mL via INTRAVENOUS

## 2019-05-13 MED ORDER — SODIUM CHLORIDE 0.9% FLUSH: 3.0000 mL | Freq: Once | INTRAVENOUS | Status: DC

## 2019-05-13 NOTE — ED Triage Notes (Signed)
States while working today, felt lightheaded and nearly passed out.  Went home and felt nervous, felt hands and legs feel tingly and still feeling dizzy.  AAOx3.  Skin warm and dry.  MAE equally and strong.  Anxious.

## 2019-05-13 NOTE — ED Provider Notes (Addendum)
[facility] Emergency Department Provider Note  ____________________________________________   First MD Initiated Contact with Patient 05/13/19 2205     (approximate)  I have reviewed the triage vital signs and the nursing notes.   HISTORY  Chief Complaint Dizziness    HPI Eric Mclaughlin is a 58 y.o. male  Here with lightheadedness, palpitations. Pt reports that earlier today, he turned his head to get up at work and began to feel acutely lightheaded with palpitations. He felt flushed and like he was going to pass out. He subsequently went home, and has cnotinued to intermittently feel palpitations and SOB. He denies h/o similar episodes. No overt CP. Denies any h/o cardiac disease or arrhythmia. No other medical complaints. No recent med changes or oTC med use.        Past Medical History:  Diagnosis Date   Known health problems: none 07/04/2017   Known health problems: none     Patient Active Problem List   Diagnosis Date Noted   Near syncope 05/14/2019   Pulmonary hypertension (HCC) 05/14/2019   Tachycardia 05/14/2019    Past Surgical History:  Procedure Laterality Date   KNEE ARTHROSCOPY Left 1994    Prior to Admission medications   Not on File    Allergies Wasp venom  Family History  Problem Relation Age of Onset   Scoliosis Mother    Heart disease Father    Stroke Father     Social History Social History   Tobacco Use   Smoking status: Never Smoker   Smokeless tobacco: Never Used  Substance Use Topics   Alcohol use: Not on file   Drug use: Not on file    Review of Systems  Review of Systems  Constitutional: Positive for fatigue. Negative for chills and fever.  HENT: Negative for sore throat.   Respiratory: Negative for shortness of breath.   Cardiovascular: Positive for palpitations. Negative for chest pain.  Gastrointestinal: Negative for abdominal pain.  Genitourinary: Negative for flank pain.    Musculoskeletal: Negative for neck pain.  Skin: Negative for rash and wound.  Allergic/Immunologic: Negative for immunocompromised state.  Neurological: Positive for light-headedness. Negative for weakness and numbness.  Hematological: Does not bruise/bleed easily.     ____________________________________________  PHYSICAL EXAM:      VITAL SIGNS: ED Triage Vitals  Enc Vitals Group     BP 05/13/19 1747 (!) 163/108     Pulse Rate 05/13/19 1747 94     Resp 05/13/19 1747 16     Temp 05/13/19 1747 98 F (36.7 C)     Temp Source 05/13/19 1747 Oral     SpO2 05/13/19 1747 99 %     Weight 05/13/19 1748 250 lb (113.4 kg)     Height 05/13/19 1748 6' (1.829 m)     Head Circumference --      Peak Flow --      Pain Score 05/13/19 1748 0     Pain Loc --      Pain Edu? --      Excl. in GC? --      Physical Exam Vitals signs and nursing note reviewed.  Constitutional:      General: He is not in acute distress.    Appearance: He is well-developed.  HENT:     Head: Normocephalic and atraumatic.  Eyes:     Conjunctiva/sclera: Conjunctivae normal.  Neck:     Musculoskeletal: Neck supple.  Cardiovascular:     Rate and Rhythm:  Normal rate. Rhythm irregular.     Heart sounds: Normal heart sounds.  Pulmonary:     Effort: Pulmonary effort is normal. No respiratory distress.     Breath sounds: No wheezing.  Abdominal:     General: There is no distension.  Musculoskeletal:     Right lower leg: Edema present.     Left lower leg: Edema present.  Skin:    General: Skin is warm.     Capillary Refill: Capillary refill takes less than 2 seconds.     Findings: No rash.  Neurological:     Mental Status: He is alert and oriented to person, place, and time.     Motor: No abnormal muscle tone.       ____________________________________________   LABS (all labs ordered are listed, but only abnormal results are displayed)  Labs Reviewed  BASIC METABOLIC PANEL - Abnormal; Notable for  the following components:      Result Value   Glucose, Bld 135 (*)    All other components within normal limits  CBC - Abnormal; Notable for the following components:   MCH 34.2 (*)    All other components within normal limits  SARS CORONAVIRUS 2 (TAT 6-24 HRS)  BRAIN NATRIURETIC PEPTIDE  URINALYSIS, COMPLETE (UACMP) WITH MICROSCOPIC  TROPONIN I (HIGH SENSITIVITY)  TROPONIN I (HIGH SENSITIVITY)    ____________________________________________  EKG: Normal sinus rhythm, VR 88. PR 154, QRS 86, QTc 413. No acute ST segment changes. ________________________________________  RADIOLOGY All imaging, including plain films, CT scans, and ultrasounds, independently reviewed by me, and interpretations confirmed via formal radiology reads.  ED MD interpretation:   CXR: Clear CT Angio: No PE, possible pulmonary HTN  Official radiology report(s): Ct Angio Chest Pe W And/or Wo Contrast  Result Date: 05/13/2019 CLINICAL DATA:  Lightheadedness, palpitations, elevated blood pressure EXAM: CT ANGIOGRAPHY CHEST WITH CONTRAST TECHNIQUE: Multidetector CT imaging of the chest was performed using the standard protocol during bolus administration of intravenous contrast. Multiplanar CT image reconstructions and MIPs were obtained to evaluate the vascular anatomy. CONTRAST:  OMNIPAQUE IOHEXOL 350 MG/ML SOLN COMPARISON:  CT chest 12/23/2013 FINDINGS: Cardiovascular: Satisfactory opacification the pulmonary arteries to the segmental level. No pulmonary artery filling defects are identified. Mild dilation of the distal left main pulmonary artery similar to prior. Central pulmonary arteries are otherwise normal caliber. No elevation of the RV/LV ratio (0.66). Normal heart size. No pericardial effusion. Atherosclerotic calcification of the coronary arteries. Normal caliber of the thoracic aorta. Normal 3 vessel branching of the aortic arch. Mediastinum/Nodes: No enlarged mediastinal, hilar, or axillary lymph  nodes. Thyroid gland, trachea, and esophagus demonstrate no significant findings. Lungs/Pleura: Dependent atelectasis posteriorly. No consolidation, features of edema, pneumothorax, or effusion. No suspicious pulmonary nodules or masses. Upper Abdomen: No acute abnormalities present in the visualized portions of the upper abdomen. Musculoskeletal: Multilevel degenerative changes are present in the imaged portions of the spine. Review of the MIP images confirms the above findings. IMPRESSION: 1. No evidence of pulmonary embolism. 2. No acute intrathoracic process. 3. Mild dilation of the distal left main pulmonary artery similar to prior study, nonspecific but could suggest pulmonary arterial hypertension. 4. Aortic Atherosclerosis (ICD10-I70.0). Electronically Signed   By: Kreg Shropshire M.D.   On: 05/13/2019 23:31    ____________________________________________  PROCEDURES   Procedure(s) performed (including Critical Care):  Procedures  ____________________________________________  INITIAL IMPRESSION / MDM / ASSESSMENT AND PLAN / ED COURSE  As part of my medical decision making, I reviewed the following  data within the [place] notes reviewed and incorporated, Old chart reviewed, Notes from prior ED visits, and [place] Controlled Substance Database       *Eric Mclaughlin was evaluated in Emergency Department on 05/14/2019 for the symptoms described in the history of present illness. He was evaluated in the context of the global COVID-19 pandemic, which necessitated consideration that the patient might be at risk for infection with the SARS-CoV-2 virus that causes COVID-19. Institutional protocols and algorithms that pertain to the evaluation of patients at risk for COVID-19 are in a state of rapid change based on information released by regulatory bodies including the CDC and federal and state organizations. These policies and algorithms were followed during the patient's  care in the ED.  Some ED evaluations and interventions may be delayed as a result of limited staffing during the pandemic.*     Medical Decision Making:  58 yo M here with transient lightheadedness, SOB, palpitations. On arrival, he is HDS but with frequent PVCs and noticed 3-4 beat runs of NSVT. With time, ectopy improved and HR 80s. Labs are overall reassuring with neg trop x 2. CXR clear. Given +CP, SOB, lightheadedness, with tachypnea and reported leg swelling, CT angio obtained and is neg with exception of mild pulmonary dilation. Will admit for obs, monitoring for ectopy. ____________________________________________  FINAL CLINICAL IMPRESSION(S) / ED DIAGNOSES  Final diagnoses:  Near syncope     MEDICATIONS GIVEN DURING THIS VISIT:  Medications  iohexol (OMNIPAQUE) 350 MG/ML injection 100 mL (100 mLs Intravenous Contrast Given 05/13/19 2315)     ED Discharge Orders    None       Note:  This document was prepared using Dragon voice recognition software and may include unintentional dictation errors.   Duffy Bruce, MD 05/14/19 Bertha Stakes    Duffy Bruce, MD 05/26/19 1200

## 2019-05-13 NOTE — ED Notes (Signed)
Patient states he got light headed and about passed out. Patient denies LOC. Patient states heart started beating fast and BP went up 160's/100's.

## 2019-05-14 ENCOUNTER — Observation Stay
Admit: 2019-05-14 | Discharge: 2019-05-14 | Disposition: A | Discharge status: Home / Self Care | Payer: Managed Care, Other (non HMO) | Attending: Internal Medicine | Admitting: Internal Medicine

## 2019-05-14 ENCOUNTER — Observation Stay: Payer: Managed Care, Other (non HMO)

## 2019-05-14 DIAGNOSIS — R55 Syncope and collapse: Secondary | ICD-10-CM | POA: Diagnosis not present

## 2019-05-14 DIAGNOSIS — I10 Essential (primary) hypertension: Secondary | ICD-10-CM | POA: Diagnosis present

## 2019-05-14 DIAGNOSIS — I272 Pulmonary hypertension, unspecified: Secondary | ICD-10-CM | POA: Diagnosis not present

## 2019-05-14 DIAGNOSIS — R000 Tachycardia, unspecified: Secondary | ICD-10-CM | POA: Diagnosis present

## 2019-05-14 DIAGNOSIS — R030 Elevated blood-pressure reading, without diagnosis of hypertension: Secondary | ICD-10-CM | POA: Diagnosis present

## 2019-05-14 LAB — URINALYSIS, COMPLETE (UACMP) WITH MICROSCOPIC
Bacteria, UA: NONE SEEN
Bilirubin Urine: NEGATIVE
Glucose, UA: NEGATIVE mg/dL
Hgb urine dipstick: NEGATIVE
Ketones, ur: 5 mg/dL — AB
Leukocytes,Ua: NEGATIVE
Nitrite: NEGATIVE
Protein, ur: NEGATIVE mg/dL
Specific Gravity, Urine: 1.036 — ABNORMAL HIGH (ref 1.005–1.030)
Squamous Epithelial / LPF: NONE SEEN (ref 0–5)
pH: 6 (ref 5.0–8.0)

## 2019-05-14 LAB — COMPREHENSIVE METABOLIC PANEL
ALT: 23 U/L (ref 0–44)
AST: 20 U/L (ref 15–41)
Albumin: 4.3 g/dL (ref 3.5–5.0)
Alkaline Phosphatase: 81 U/L (ref 38–126)
Anion gap: 11 (ref 5–15)
BUN: 9 mg/dL (ref 6–20)
CO2: 24 mmol/L (ref 22–32)
Calcium: 9 mg/dL (ref 8.9–10.3)
Chloride: 105 mmol/L (ref 98–111)
Creatinine, Ser: 0.84 mg/dL (ref 0.61–1.24)
GFR calc Af Amer: >60 mL/min (ref >60)
GFR calc non Af Amer: >60 mL/min (ref >60)
Glucose, Bld: 96 mg/dL (ref 70–99)
Potassium: 3.6 mmol/L (ref 3.5–5.1)
Sodium: 140 mmol/L (ref 135–145)
Total Bilirubin: 1.7 mg/dL — ABNORMAL HIGH (ref 0.3–1.2)
Total Protein: 7.4 g/dL (ref 6.5–8.1)

## 2019-05-14 LAB — CBC
HCT: 43.4 % (ref 39.0–52.0)
Hemoglobin: 15.6 g/dL (ref 13.0–17.0)
MCH: 34.4 pg — ABNORMAL HIGH (ref 26.0–34.0)
MCHC: 35.9 g/dL (ref 30.0–36.0)
MCV: 95.6 fL (ref 80.0–100.0)
Platelets: 247 10*3/uL (ref 150–400)
RBC: 4.54 MIL/uL (ref 4.22–5.81)
RDW: 12.1 % (ref 11.5–15.5)
WBC: 6.2 10*3/uL (ref 4.0–10.5)
nRBC: 0 % (ref 0.0–0.2)

## 2019-05-14 LAB — HIV ANTIBODY (ROUTINE TESTING W REFLEX): HIV Screen 4th Generation wRfx: NONREACTIVE

## 2019-05-14 LAB — ECHOCARDIOGRAM COMPLETE
Height: 72 in
Weight: 4000.03 oz

## 2019-05-14 LAB — TSH: TSH: 1.797 u[IU]/mL (ref 0.350–4.500)

## 2019-05-14 LAB — BRAIN NATRIURETIC PEPTIDE: B Natriuretic Peptide: 40 pg/mL (ref 0.0–100.0)

## 2019-05-14 LAB — SARS CORONAVIRUS 2 (TAT 6-24 HRS): SARS Coronavirus 2: NEGATIVE

## 2019-05-14 MED ORDER — SODIUM CHLORIDE 0.9 % IV SOLN
INTRAVENOUS | Status: DC
  Administered 2019-05-14: 07:00:00 via INTRAVENOUS

## 2019-05-14 MED ORDER — ENOXAPARIN SODIUM 40 MG/0.4ML ~~LOC~~ SOLN
40.0000 mg | SUBCUTANEOUS | Status: DC
  Administered 2019-05-14: 40 mg via SUBCUTANEOUS
  Filled 2019-05-14 (×2): qty 0.4

## 2019-05-14 MED ORDER — ACETAMINOPHEN 325 MG PO TABS: 650.0000 mg | ORAL_TABLET | Freq: Four times a day (QID) | ORAL | Status: DC | PRN

## 2019-05-14 MED ORDER — SODIUM CHLORIDE 0.9 % IV BOLUS: 1000.0000 mL | Freq: Once | INTRAVENOUS | Status: DC

## 2019-05-14 MED ORDER — ACETAMINOPHEN 650 MG RE SUPP: 650.0000 mg | Freq: Four times a day (QID) | RECTAL | Status: DC | PRN

## 2019-05-14 NOTE — H&P (Addendum)
TRH H&P    Patient Demographics:    Eric Mclaughlin, is a 58 y.o. male  MRN: [id number]  DOB - 1960/09/27  Admit Date - 05/13/2019  Referring MD/NP/PA:  Reinaldo Meeker  Outpatient Primary MD for the patient is Patient, No Pcp Per  Patient coming from:  home  Chief complaint-   Near syncope   HPI:    Eric Mclaughlin  is a 58 y.o. 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  Pt denies headache, vision change, cp, palp, sob, n/v, diarrhea, brbpr, numbness, tingling, weakness, dysuria.  Pt presented to ED for evaluation due to near syncope.  Pt noted that his heart rate was fast.   In ED,  T 98 p 94 R 16, Bp 163/108  Pox 99% on RA Wt 113.4kg  Hr 228 per EKG  CTA chest IMPRESSION: 1. No evidence of pulmonary embolism. 2. No acute intrathoracic process. 3. Mild dilation of the distal left main pulmonary artery similar to prior study, nonspecific but could suggest pulmonary arterial hypertension.  Na 138, K 3.7, Bun 10, Creatinine 0.96 Wbc 8.7, hgb 15.9, Plt 244 Trop 3 -> 5  BNP 40 Urinalysis negative  ekg nsr at 85, nl axis, nl int, no st -t changes c/w ischemia No short pr  Pt will be admitted for near syncope.    Review of systems:    In addition to the HPI above,  No Fever-chills, No Headache, No changes with Vision or hearing, No problems swallowing food or Liquids, No Chest pain, Cough or Shortness of Breath, No Abdominal pain, No Nausea or Vomiting, bowel movements are regular, No Blood in stool or Urine, No dysuria, No new skin rashes or bruises, No new joints pains-aches,  No new weakness, tingling, numbness in any extremity, No recent weight gain or loss, No polyuria, polydypsia or polyphagia, No significant Mental Stressors.   All other systems reviewed and are negative.    Past History of the following :    Past Medical History:  Diagnosis Date  . Known health problems: none 07/04/2017  . Known health problems: none       Past Surgical History:  Procedure Laterality Date  . KNEE ARTHROSCOPY Left 1994      Social History:      Social History   Tobacco Use  . Smoking status: Never Smoker  . Smokeless tobacco: Never Used  Substance Use Topics  . Alcohol use: Not on file       Family History :     Family History  Problem Relation Age of Onset  . Scoliosis Mother   . Heart disease Father   . Stroke Father        Home Medications:   Prior to Admission medications   Not on File     Allergies:     Allergies  Allergen Reactions  . Wasp Venom      Physical Exam:  Vitals  Blood pressure (!) 152/93, pulse 70, temperature 98 F (36.7 C), temperature source Oral, resp. rate 14, height 6' (1.829 m), weight 113.4 kg, SpO2 98 %.  1.  General: axoxo3  2. Psychiatric: euthymic  3. Neurologic: Nonfocal, cn2-12 intact, reflexes 2+ symmetric, diffuse with no clonus, motor 5/5in all 4 ext  4. HEENMT:  Anicteric, pupils 1.445mm symmetric, direct, consensual, near intact Neck: no jvd  5. Respiratory : CTAB  6. Cardiovascular : rrr s1, s2, no mg/r/  7. Gastrointestinal:  Abd: soft, nt, nd, +bs  8. Skin:  Ext: no c/c/e,  No rash  9.Musculoskeletal:  Good ROM    Data Review:    CBC Recent Labs  Lab 05/13/19 1750  WBC 8.7  HGB 15.9  HCT 44.7  PLT 244  MCV 96.1  MCH 34.2*  MCHC 35.6  RDW 12.1   ------------------------------------------------------------------------------------------------------------------  Results for orders placed or performed during the hospital encounter of 05/13/19 (from the past 48 hour(s))  Basic metabolic panel     Status: Abnormal   Collection Time: 05/13/19  5:50 PM  Result Value Ref Range   Sodium 138 135 - 145 mmol/L    Potassium 3.7 3.5 - 5.1 mmol/L   Chloride 103 98 - 111 mmol/L   CO2 25 22 - 32 mmol/L   Glucose, Bld 135 (H) 70 - 99 mg/dL   BUN 10 6 - 20 mg/dL   Creatinine, Ser 1.610.96 0.61 - 1.24 mg/dL   Calcium 9.5 8.9 - 09.610.3 mg/dL   GFR calc non Af Amer >60 >60 mL/min   GFR calc Af Amer >60 >60 mL/min   Anion gap 10 5 - 15    Comment: Performed at [facility], [address] [place], [place] [address]  CBC     Status: Abnormal   Collection Time: 05/13/19  5:50 PM  Result Value Ref Range   WBC 8.7 4.0 - 10.5 K/uL   RBC 4.65 4.22 - 5.81 MIL/uL   Hemoglobin 15.9 13.0 - 17.0 g/dL   HCT 98.144.7 19.139.0 - 47.852.0 %   MCV 96.1 80.0 - 100.0 fL   MCH 34.2 (H) 26.0 - 34.0 pg   MCHC 35.6 30.0 - 36.0 g/dL   RDW 29.512.1 62.111.5 - 30.815.5 %   Platelets 244 150 - 400 K/uL   nRBC 0.0 0.0 - 0.2 %    Comment: Performed at [facility], [address] [place], [place] [address]  Troponin I (High Sensitivity)     Status: None   Collection Time: 05/13/19  5:50 PM  Result Value Ref Range   Troponin I (High Sensitivity) 3 <18 ng/L    Comment: (NOTE) Elevated high sensitivity troponin I (hsTnI) values and significant  changes across serial measurements may suggest ACS but many other  chronic and acute conditions are known to elevate hsTnI results.  Refer to the "Links" section for chest pain algorithms and additional  guidance. Performed at [facility], [address] [place], [place] [address]   Brain natriuretic peptide     Status: None   Collection Time: 05/13/19  5:50 PM  Result Value Ref Range   B Natriuretic Peptide 40.0 0.0 - 100.0 pg/mL    Comment: Performed at [facility], [address] [place], [place] [address]  Urinalysis, Complete w Microscopic     Status: Abnormal   Collection Time: 05/13/19  9:49 PM  Result Value Ref Range   Color, Urine STRAW (A) YELLOW   APPearance  CLEAR (A) CLEAR   Specific Gravity, Urine 1.036 (H) 1.005 - 1.030   pH 6.0 5.0 - 8.0    Glucose, UA NEGATIVE NEGATIVE mg/dL   Hgb urine dipstick NEGATIVE NEGATIVE   Bilirubin Urine NEGATIVE NEGATIVE   Ketones, ur 5 (A) NEGATIVE mg/dL   Protein, ur NEGATIVE NEGATIVE mg/dL   Nitrite NEGATIVE NEGATIVE   Leukocytes,Ua NEGATIVE NEGATIVE   WBC, UA 0-5 0 - 5 WBC/hpf   Bacteria, UA NONE SEEN NONE SEEN   Squamous Epithelial / LPF NONE SEEN 0 - 5   Mucus PRESENT     Comment: Performed at [facility], [address] [place], [place] [address]  Troponin I (High Sensitivity)     Status: None   Collection Time: 05/13/19  9:49 PM  Result Value Ref Range   Troponin I (High Sensitivity) 5 <18 ng/L    Comment: (NOTE) Elevated high sensitivity troponin I (hsTnI) values and significant  changes across serial measurements may suggest ACS but many other  chronic and acute conditions are known to elevate hsTnI results.  Refer to the "Links" section for chest pain algorithms and additional  guidance. Performed at [facility], [address] [place], [place] [address]     Chemistries  Recent Labs  Lab 05/13/19 1750  NA 138  K 3.7  CL 103  CO2 25  GLUCOSE 135*  BUN 10  CREATININE 0.96  CALCIUM 9.5   ------------------------------------------------------------------------------------------------------------------  ------------------------------------------------------------------------------------------------------------------ GFR: Estimated Creatinine Clearance: 110.4 mL/min (by C-G formula based on SCr of 0.96 mg/dL). Liver Function Tests: No results for input(s): AST, ALT, ALKPHOS, BILITOT, PROT, ALBUMIN in the last 168 hours. No results for input(s): LIPASE, AMYLASE in the last 168 hours. No results for input(s): AMMONIA in the last 168 hours. Coagulation Profile: No results for input(s): INR, PROTIME in the last 168 hours. Cardiac Enzymes: No results for input(s): CKTOTAL, CKMB, CKMBINDEX, TROPONINI in the last 168 hours. BNP (last 3 results) No  results for input(s): PROBNP in the last 8760 hours. HbA1C: No results for input(s): HGBA1C in the last 72 hours. CBG: No results for input(s): GLUCAP in the last 168 hours. Lipid Profile: No results for input(s): CHOL, HDL, LDLCALC, TRIG, CHOLHDL, LDLDIRECT in the last 72 hours. Thyroid Function Tests: No results for input(s): TSH, T4TOTAL, FREET4, T3FREE, THYROIDAB in the last 72 hours. Anemia Panel: No results for input(s): VITAMINB12, FOLATE, FERRITIN, TIBC, IRON, RETICCTPCT in the last 72 hours.  --------------------------------------------------------------------------------------------------------------- Urine analysis:    Component Value Date/Time   COLORURINE STRAW (A) 05/13/2019 2149   APPEARANCEUR CLEAR (A) 05/13/2019 2149   LABSPEC 1.036 (H) 05/13/2019 2149   PHURINE 6.0 05/13/2019 2149   GLUCOSEU NEGATIVE 05/13/2019 2149   HGBUR NEGATIVE 05/13/2019 2149   [place] NEGATIVE 05/13/2019 2149   KETONESUR 5 (A) 05/13/2019 2149   PROTEINUR NEGATIVE 05/13/2019 2149   NITRITE NEGATIVE 05/13/2019 2149   LEUKOCYTESUR NEGATIVE 05/13/2019 2149      Imaging Results:    Ct Angio Chest Pe W And/or Wo Contrast  Result Date: 05/13/2019 CLINICAL DATA:  Lightheadedness, palpitations, elevated blood pressure EXAM: CT ANGIOGRAPHY CHEST WITH CONTRAST TECHNIQUE: Multidetector CT imaging of the chest was performed using the standard protocol during bolus administration of intravenous contrast. Multiplanar CT image reconstructions and MIPs were obtained to evaluate the vascular anatomy. CONTRAST:  149mL OMNIPAQUE IOHEXOL 350 MG/ML SOLN COMPARISON:  CT chest 12/23/2013 FINDINGS: Cardiovascular: Satisfactory opacification the pulmonary arteries to the segmental level. No pulmonary artery filling defects are identified.  Mild dilation of the distal left main pulmonary artery similar to prior. Central pulmonary arteries are otherwise normal caliber. No elevation of the RV/LV ratio (0.66). Normal  heart size. No pericardial effusion. Atherosclerotic calcification of the coronary arteries. Normal caliber of the thoracic aorta. Normal 3 vessel branching of the aortic arch. Mediastinum/Nodes: No enlarged mediastinal, hilar, or axillary lymph nodes. Thyroid gland, trachea, and esophagus demonstrate no significant findings. Lungs/Pleura: Dependent atelectasis posteriorly. No consolidation, features of edema, pneumothorax, or effusion. No suspicious pulmonary nodules or masses. Upper Abdomen: No acute abnormalities present in the visualized portions of the upper abdomen. Musculoskeletal: Multilevel degenerative changes are present in the imaged portions of the spine. Review of the MIP images confirms the above findings. IMPRESSION: 1. No evidence of pulmonary embolism. 2. No acute intrathoracic process. 3. Mild dilation of the distal left main pulmonary artery similar to prior study, nonspecific but could suggest pulmonary arterial hypertension. 4. Aortic Atherosclerosis (ICD10-I70.0). Electronically Signed   By: Kreg Shropshire M.D.   On: 05/13/2019 23:31       Assessment & Plan:    Principal Problem:   Near syncope Active Problems:   Pulmonary hypertension (HCC)   Tachycardia  Near syncope.  Tele Trop I Orthostatic bp requested Check CT brain Check carotid  Ultrasound Check cardiac echo   Tachycardia Tele Check tsh Please consult cardiology this AM, may need holter monitor  Pulmonary hypertension seen on CTA Check cardiac echo as above  Hyperglycemia Check hga1c    DVT Prophylaxis-   Lovenox - SCDs   AM Labs Ordered, also please review Full Orders  Family Communication: Admission, patients condition and plan of care including tests being ordered have been discussed with the patient  who indicate understanding and agree with the plan and Code Status.  Code Status:  FULL CODE per patient  Admission status: Observationt: Based on patients clinical presentation and evaluation of  above clinical data, I have made determination that patient meets observation criteria at this time.  Time spent in minutes : 55 minutes   Pearson Grippe M.D on 05/14/2019 at 3:07 AM

## 2019-05-14 NOTE — Progress Notes (Signed)
*  PRELIMINARY RESULTS* Echocardiogram 2D Echocardiogram has been performed.  Eric Mclaughlin 05/14/2019, 8:19 AM 

## 2019-05-14 NOTE — ED Notes (Signed)
ED TO INPATIENT HANDOFF REPORT  ED Nurse Name and Phone #: Johnney Ou [phone]  S Name/Age/Gender Eric Mclaughlin 58 y.o. male Room/Bed: ED09A/ED09A  Code Status   Code Status: Full Code  Home/SNF/Other Home Patient oriented to: self, place, time and situation Is this baseline? Yes   Triage Complete: Triage complete  Chief Complaint Lawrence Santiago; Sunday Corn headed  Triage Note States while working today, felt lightheaded and nearly passed out.  Went home and felt nervous, felt hands and legs feel tingly and still feeling dizzy.  AAOx3.  Skin warm and dry.  MAE equally and strong.  Anxious.   Allergies Allergies  Allergen Reactions  . Wasp Venom     Level of Care/Admitting Diagnosis ED Disposition    ED Disposition Condition [facility]: [place] [[id number]]  Level of Care: Telemetry [5]  Covid Evaluation: Asymptomatic Screening Protocol (No Symptoms)  Diagnosis: Near syncope [284132]  Admitting Physician: Jani Gravel [[id number]]  Attending Physician: Jani Gravel [[id number]]  PT Class (Do Not Modify): Observation [104]  PT Acc Code (Do Not Modify): Observation [10022]       B Medical/Surgery History Past Medical History:  Diagnosis Date  . Known health problems: none 07/04/2017  . Known health problems: none    Past Surgical History:  Procedure Laterality Date  . KNEE ARTHROSCOPY Left 1994     A IV Location/Drains/Wounds Patient Lines/Drains/Airways Status   Active Line/Drains/Airways    Name:   Placement date:   Placement time:   Site:   Days:   Peripheral IV 05/13/19 Right Antecubital   05/13/19    2257    Antecubital   1          Intake/Output Last 24 hours No intake or output data in the 24 hours ending 05/14/19 0535  Labs/Imaging Results for orders placed or performed during the hospital encounter of 05/13/19 (from the past 48 hour(s))  Basic metabolic panel     Status: Abnormal   Collection Time: 05/13/19   5:50 PM  Result Value Ref Range   Sodium 138 135 - 145 mmol/L   Potassium 3.7 3.5 - 5.1 mmol/L   Chloride 103 98 - 111 mmol/L   CO2 25 22 - 32 mmol/L   Glucose, Bld 135 (H) 70 - 99 mg/dL   BUN 10 6 - 20 mg/dL   Creatinine, Ser 0.96 0.61 - 1.24 mg/dL   Calcium 9.5 8.9 - 10.3 mg/dL   GFR calc non Af Amer >60 >60 mL/min   GFR calc Af Amer >60 >60 mL/min   Anion gap 10 5 - 15    Comment: Performed at [facility], [address] [place], [place] [address]  CBC     Status: Abnormal   Collection Time: 05/13/19  5:50 PM  Result Value Ref Range   WBC 8.7 4.0 - 10.5 K/uL   RBC 4.65 4.22 - 5.81 MIL/uL   Hemoglobin 15.9 13.0 - 17.0 g/dL   HCT 44.7 39.0 - 52.0 %   MCV 96.1 80.0 - 100.0 fL   MCH 34.2 (H) 26.0 - 34.0 pg   MCHC 35.6 30.0 - 36.0 g/dL   RDW 12.1 11.5 - 15.5 %   Platelets 244 150 - 400 K/uL   nRBC 0.0 0.0 - 0.2 %    Comment: Performed at [facility], [address] [place], [place] [address]  Troponin I (High Sensitivity)     Status: None   Collection Time:  05/13/19  5:50 PM  Result Value Ref Range   Troponin I (High Sensitivity) 3 <18 ng/L    Comment: (NOTE) Elevated high sensitivity troponin I (hsTnI) values and significant  changes across serial measurements may suggest ACS but many other  chronic and acute conditions are known to elevate hsTnI results.  Refer to the "Links" section for chest pain algorithms and additional  guidance. Performed at [facility], [address] [place], [place] [address]   Brain natriuretic peptide     Status: None   Collection Time: 05/13/19  5:50 PM  Result Value Ref Range   B Natriuretic Peptide 40.0 0.0 - 100.0 pg/mL    Comment: Performed at [facility], [address] [place], [place] [address]  Urinalysis, Complete w Microscopic     Status: Abnormal   Collection Time: 05/13/19  9:49 PM  Result Value Ref Range   Color, Urine STRAW (A) YELLOW   APPearance CLEAR (A) CLEAR   Specific  Gravity, Urine 1.036 (H) 1.005 - 1.030   pH 6.0 5.0 - 8.0   Glucose, UA NEGATIVE NEGATIVE mg/dL   Hgb urine dipstick NEGATIVE NEGATIVE   Bilirubin Urine NEGATIVE NEGATIVE   Ketones, ur 5 (A) NEGATIVE mg/dL   Protein, ur NEGATIVE NEGATIVE mg/dL   Nitrite NEGATIVE NEGATIVE   Leukocytes,Ua NEGATIVE NEGATIVE   WBC, UA 0-5 0 - 5 WBC/hpf   Bacteria, UA NONE SEEN NONE SEEN   Squamous Epithelial / LPF NONE SEEN 0 - 5   Mucus PRESENT     Comment: Performed at [facility], [address], [place], [place] [address]  Troponin I (High Sensitivity)     Status: None   Collection Time: 05/13/19  9:49 PM  Result Value Ref Range   Troponin I (High Sensitivity) 5 <18 ng/L    Comment: (NOTE) Elevated high sensitivity troponin I (hsTnI) values and significant  changes across serial measurements may suggest ACS but many other  chronic and acute conditions are known to elevate hsTnI results.  Refer to the "Links" section for chest pain algorithms and additional  guidance. Performed at [facility], [address] [place], [place] [address]    Ct Angio Chest Pe W And/or Wo Contrast  Result Date: 05/13/2019 CLINICAL DATA:  Lightheadedness, palpitations, elevated blood pressure EXAM: CT ANGIOGRAPHY CHEST WITH CONTRAST TECHNIQUE: Multidetector CT imaging of the chest was performed using the standard protocol during bolus administration of intravenous contrast. Multiplanar CT image reconstructions and MIPs were obtained to evaluate the vascular anatomy. CONTRAST:  OMNIPAQUE IOHEXOL 350 MG/ML SOLN COMPARISON:  CT chest 12/23/2013 FINDINGS: Cardiovascular: Satisfactory opacification the pulmonary arteries to the segmental level. No pulmonary artery filling defects are identified. Mild dilation of the distal left main pulmonary artery similar to prior. Central pulmonary arteries are otherwise normal caliber. No elevation of the RV/LV ratio (0.66). Normal heart size. No pericardial  effusion. Atherosclerotic calcification of the coronary arteries. Normal caliber of the thoracic aorta. Normal 3 vessel branching of the aortic arch. Mediastinum/Nodes: No enlarged mediastinal, hilar, or axillary lymph nodes. Thyroid gland, trachea, and esophagus demonstrate no significant findings. Lungs/Pleura: Dependent atelectasis posteriorly. No consolidation, features of edema, pneumothorax, or effusion. No suspicious pulmonary nodules or masses. Upper Abdomen: No acute abnormalities present in the visualized portions of the upper abdomen. Musculoskeletal: Multilevel degenerative changes are present in the imaged portions of the spine. Review of the MIP images confirms the above findings. IMPRESSION: 1. No evidence of pulmonary embolism. 2. No  acute intrathoracic process. 3. Mild dilation of the distal left main pulmonary artery similar to prior study, nonspecific but could suggest pulmonary arterial hypertension. 4. Aortic Atherosclerosis (ICD10-I70.0). Electronically Signed   By: Kreg ShropshirePrice  DeHay M.D.   On: 05/13/2019 23:31    Pending Labs Unresulted Labs (From admission, onward)    Start     Ordered   05/21/19 0500  Creatinine, serum  (enoxaparin (LOVENOX)    CrCl >/= 30 ml/min)  Weekly,   STAT    Comments: while on enoxaparin therapy    05/14/19 0334   05/14/19 0500  HIV Antibody (routine testing w rflx)  (HIV Antibody (Routine testing w reflex) panel)  Tomorrow morning,   STAT     05/14/19 0334   05/14/19 0500  Comprehensive metabolic panel  Tomorrow morning,   STAT     05/14/19 0334   05/14/19 0500  CBC  Tomorrow morning,   STAT     05/14/19 0334   05/14/19 0500  TSH  Tomorrow morning,   STAT     05/14/19 0334   05/14/19 0500  Hemoglobin A1c  Tomorrow morning,   STAT     05/14/19 0334   05/14/19 0015  SARS CORONAVIRUS 2 (TAT 6-24 HRS) Nasopharyngeal Nasopharyngeal Swab  (Asymptomatic/Tier 3)  Once,   STAT    Question Answer Comment  Is this test for diagnosis or screening Screening    Symptomatic for COVID-19 as defined by CDC No   Hospitalized for COVID-19 No   Admitted to ICU for COVID-19 No   Previously tested for COVID-19 No   Resident in a congregate (group) care setting No   Employed in healthcare setting No      05/14/19 0014          Vitals/Pain Today's Vitals   05/14/19 0030 05/14/19 0100 05/14/19 0245 05/14/19 0300  BP: (!) 161/100 (!) 152/93 129/85 124/87  Pulse: 75 70 86 79  Resp: 18 14 (!) 23 20  Temp:      TempSrc:      SpO2: 96% 98% 98% 97%  Weight:      Height:      PainSc:        Isolation Precautions No active isolations  Medications Medications  enoxaparin (LOVENOX) injection 40 mg (has no administration in time range)  0.9 %  sodium chloride infusion (has no administration in time range)  acetaminophen (TYLENOL) tablet 650 mg (has no administration in time range)    Or  acetaminophen (TYLENOL) suppository 650 mg (has no administration in time range)  iohexol (OMNIPAQUE) 350 MG/ML injection 100 mL (100 mLs Intravenous Contrast Given 05/13/19 2315)    Mobility walks Low fall risk   Focused Assessments Cardiac Assessment Handoff:    Lab Results  Component Value Date   CKTOTAL 74 12/23/2013   CKMB 2.4 12/23/2013   TROPONINI < 0.02 12/23/2013   No results found for: DDIMER Does the Patient currently have chest pain? Yes     R Recommendations: See Admitting Provider Note  Report given to:   Additional Notes: Dizziness

## 2019-05-14 NOTE — Progress Notes (Signed)
PT Cancellation Note  Patient Details Name: Eric Mclaughlin MRN: [id number] DOB: 11/13/1960   Cancelled Treatment:    Reason Eval/Treat Not Completed: PT screened, no needs identified, will sign off(PT order received, chart reveiwed, spoke with patient. He was I with all aspects of care and mobility prior to hospitalization and is no longer having any symptoms. Demo steady standing, walking, backward walking, turing quickly. Appears to be at [facility]. Patient is waiting for someone to walk him out to discharge. Consulted nursing who states he no longer needs PT eval. Will discharge from caseload as he appears to be functionally independent and not in need of skilled PT.    Everlean Alstrom. Graylon Good, PT, DPT 05/14/19, 3:26 PM

## 2019-05-14 NOTE — Plan of Care (Signed)
  Problem: Education: Goal: Knowledge of General Education information will improve Description: Including pain rating scale, medication(s)/side effects and non-pharmacologic comfort measures Outcome: Progressing   Problem: Health Behavior/Discharge Planning: Goal: Ability to manage health-related needs will improve Outcome: Progressing   Problem: Clinical Measurements: Goal: Ability to maintain clinical measurements within normal limits will improve Outcome: Not Progressing Note: Total bilirubin is elevated at 1.7. Will continue to monitor lab values. Wenda Low [facility]

## 2019-05-14 NOTE — Discharge Summary (Signed)
Physician Discharge Summary  Rochele Raringerry D Palazzola ZOX:[id number] DOB: 08/05/1960 DOA: 05/13/2019  PCP: Patient, No Pcp Per  Admit date: 05/13/2019 Discharge date: 05/14/2019  Recommendations for Outpatient Follow-up:  1. Follow up with cardiology in 1 weeks   Home Health: none Equipment/Devices: none  Discharge Condition: stable  CODE STATUS: full  Diet recommendation: heart diet  Brief/Interim Summary (HPI)  Eric Mclaughlin  is a 58 y.o. 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  Pt denies headache, vision change, cp, palp, sob, n/v, diarrhea, brbpr, numbness, tingling, weakness, dysuria.  Pt presented to ED for evaluation due to near syncope.  Pt noted that his heart rate was fast.   In ED,  T 98 p 94 R 16, Bp 163/108  Pox 99% on RA Wt 113.4kg  Hr 228 per EKG  CTA chest IMPRESSION: 1. No evidence of pulmonary embolism. 2. No acute intrathoracic process. 3. Mild dilation of the distal left main pulmonary artery similar to prior study, nonspecific but could suggest pulmonary arterial hypertension.  Na 138, K 3.7, Bun 10, Creatinine 0.96 Wbc 8.7, hgb 15.9, Plt 244 Trop 3 -> 5  BNP 40 Urinalysis negative  ekg nsr at 85, nl axis, nl int, no st -t changes c/w ischemia No short pr  Subjective   pt feels better.  No chest pain, cough, shortness of breath.  No nausea vomiting, diarrhea, abdominal pain, symptoms of UTI.  Denies unilateral weakness in extremities.  No facial droop or slurred speech.  Discharge Diagnoses and Hospital Course:   Principal Problem:   Near syncope Active Problems:   Tachycardia   Elevated blood pressure reading   Near syncope: Etiology is not clear.  Cardiology was consulted, Dr. Silas FloodParascho evaluated patient, suspecting vasovagal in nature. Pt has normal ECG, unremarkable telemetry,  negative chest CTA, for PE. Negative brain MRI for stroke. Carotid ultrasound negative. 2d echo showed left ventricular ejection fraction, by visual estimation, is 60 to 65%. The left ventricle has normal function. There is no left ventricular hypertrophy.   -pt will follow-up with card in 1 week for Holter monitor  Questionable pulmonary hypertension [facility](HCC): CTA showed mild dilation of the distal left main pulmonary artery similar to prior study, nonspecific but could suggest pulmonary arterial hypertension. 2d ech showed normal pulmonary artery systolic pressure.  Tachycardia: HR 65 currently at discharge -give 1L NS bolus  Elevated blood pressure reading: pt reports Bp was at 170s. Currently 139/97. -f/u with Card. If still perstently high, will need to start oral Bp meds.    Discharge Instructions  Discharge Instructions    Call MD for:  difficulty breathing, headache or visual disturbances   Complete by: As directed    Call MD for:  persistant dizziness or light-headedness   Complete by: As directed    Call MD for:  redness, tenderness, or signs of infection (pain, swelling, redness, odor or green/yellow discharge around incision site)   Complete by: As directed    Call MD for:  severe uncontrolled pain   Complete by: As directed    Call MD for:  temperature >100.4   Complete by: As directed    Diet - low sodium heart healthy   Complete by: As directed    Increase activity slowly   Complete by: As directed      Allergies as of 05/14/2019      Reactions  Wasp Venom       Medication List    You have not been prescribed any medications.    Follow-up Information    Paraschos, Alexander, MD Follow up in 1 week(s).   Specialty: Cardiology Contact information: [address] [facility] [place] [address] [phone]          Allergies  Allergen Reactions  . Wasp Venom     Consultations:  Card   Procedures/Studies: Ct Angio  Chest Pe W And/or Wo Contrast  Result Date: 05/13/2019 CLINICAL DATA:  Lightheadedness, palpitations, elevated blood pressure EXAM: CT ANGIOGRAPHY CHEST WITH CONTRAST TECHNIQUE: Multidetector CT imaging of the chest was performed using the standard protocol during bolus administration of intravenous contrast. Multiplanar CT image reconstructions and MIPs were obtained to evaluate the vascular anatomy. CONTRAST:  OMNIPAQUE IOHEXOL 350 MG/ML SOLN COMPARISON:  CT chest 12/23/2013 FINDINGS: Cardiovascular: Satisfactory opacification the pulmonary arteries to the segmental level. No pulmonary artery filling defects are identified. Mild dilation of the distal left main pulmonary artery similar to prior. Central pulmonary arteries are otherwise normal caliber. No elevation of the RV/LV ratio (0.66). Normal heart size. No pericardial effusion. Atherosclerotic calcification of the coronary arteries. Normal caliber of the thoracic aorta. Normal 3 vessel branching of the aortic arch. Mediastinum/Nodes: No enlarged mediastinal, hilar, or axillary lymph nodes. Thyroid gland, trachea, and esophagus demonstrate no significant findings. Lungs/Pleura: Dependent atelectasis posteriorly. No consolidation, features of edema, pneumothorax, or effusion. No suspicious pulmonary nodules or masses. Upper Abdomen: No acute abnormalities present in the visualized portions of the upper abdomen. Musculoskeletal: Multilevel degenerative changes are present in the imaged portions of the spine. Review of the MIP images confirms the above findings. IMPRESSION: 1. No evidence of pulmonary embolism. 2. No acute intrathoracic process. 3. Mild dilation of the distal left main pulmonary artery similar to prior study, nonspecific but could suggest pulmonary arterial hypertension. 4. Aortic Atherosclerosis (ICD10-I70.0). Electronically Signed   By: Kreg Shropshire M.D.   On: 05/13/2019 23:31   Mr Brain Wo Contrast  Result Date:  05/14/2019 CLINICAL DATA:  Syncope EXAM: MRI HEAD WITHOUT CONTRAST TECHNIQUE: Multiplanar, multiecho pulse sequences of the brain and surrounding structures were obtained without intravenous contrast. COMPARISON:  None. FINDINGS: Brain: No acute infarction, hemorrhage, hydrocephalus, extra-axial collection or mass lesion. Normal cerebral white matter. Vascular: Normal arterial flow voids. Skull and upper cervical spine: No focal skeletal lesion. Sinuses/Orbits: Negative Other: None IMPRESSION: Negative MRI head Electronically Signed   By: Marlan Palau M.D.   On: 05/14/2019 09:27   US Carotid Bilateral  Result Date: 05/14/2019 CLINICAL DATA:  Syncope. EXAM: BILATERAL CAROTID DUPLEX ULTRASOUND TECHNIQUE: Wallace Cullens scale imaging, color Doppler and duplex ultrasound were performed of bilateral carotid and vertebral arteries in the neck. COMPARISON:  None. FINDINGS: Criteria: Quantification of carotid stenosis is based on velocity parameters that correlate the residual internal carotid diameter with NASCET-based stenosis levels, using the diameter of the distal internal carotid lumen as the denominator for stenosis measurement. The following velocity measurements were obtained: RIGHT ICA:  69/16 cm/sec CCA:  65/20 cm/sec SYSTOLIC ICA/CCA RATIO:  1.1 ECA:  85 cm/sec LEFT ICA:  65/18 cm/sec CCA:  90/22 cm/sec SYSTOLIC ICA/CCA RATIO:  0.7 ECA:  110 cm/sec RIGHT CAROTID ARTERY: No focal plaque or evidence of carotid stenosis. Normal velocities and waveforms. RIGHT VERTEBRAL ARTERY: Antegrade flow with normal waveform and velocity. LEFT CAROTID ARTERY: Minimal noncalcified plaque at the level of the left carotid bulb. No evidence of left ICA  plaque or stenosis. LEFT VERTEBRAL ARTERY: Antegrade flow with normal waveform and velocity. IMPRESSION: No evidence of bilateral ICA plaque or stenosis. Minimal plaque at the level of the left carotid bulb. Electronically Signed   By: Irish Lack M.D.   On: 05/14/2019 11:05       Discharge Exam: Vitals:   05/14/19 0610 05/14/19 0728  BP: (!) 141/94 (!) 136/97  Pulse: 90 65  Resp: 20 19  Temp: 98.2 F (36.8 C) 98.2 F (36.8 C)  SpO2: 100% 100%   Vitals:   05/14/19 0606 05/14/19 0609 05/14/19 0610 05/14/19 0728  BP: (!) 145/99 (!) 138/95 (!) 141/94 (!) 136/97  Pulse: 69 72 90 65  Resp: Temp: (!) 97.5 F (36.4 C) 97.8 F (36.6 C) 98.2 F (36.8 C) 98.2 F (36.8 C)  TempSrc: Oral Oral Oral Oral  SpO2: 99% 100% 100% 100%  Weight:      Height:        General: Pt is alert, awake, not in acute distress Cardiovascular: RRR, S1/S2 +, no rubs, no gallops Respiratory: CTA bilaterally, no wheezing, no rhonchi Abdominal: Soft, NT, ND, bowel sounds + Extremities: no edema, no cyanosis    The results of significant diagnostics from this hospitalization (including imaging, microbiology, ancillary and laboratory) are listed below for reference.     Microbiology: Recent Results (from the past 240 hour(s))  SARS CORONAVIRUS 2 (TAT 6-24 HRS) Nasopharyngeal Nasopharyngeal Swab     Status: None   Collection Time: 05/14/19  1:01 AM   Specimen: Nasopharyngeal Swab  Result Value Ref Range Status   SARS Coronavirus 2 NEGATIVE NEGATIVE Final    Comment: (NOTE) SARS-CoV-2 target nucleic acids are NOT DETECTED. The SARS-CoV-2 RNA is generally detectable in upper and lower respiratory specimens during the acute phase of infection. Negative results do not preclude SARS-CoV-2 infection, do not rule out co-infections with other pathogens, and should not be used as the sole basis for treatment or other patient management decisions. Negative results must be combined with clinical observations, patient history, and epidemiological information. The expected result is Negative. Fact Sheet for Patients: HairSlick.no Fact Sheet for Healthcare Providers: quierodirigir.com This test is not yet approved  or cleared by the [place] FDA and  has been authorized for detection and/or diagnosis of SARS-CoV-2 by FDA under an Emergency Use Authorization (EUA). This EUA will remain  in effect (meaning this test can be used) for the duration of the COVID-19 declaration under Section 56 4(b)(1) of the Act, 21 U.S.C. section 360bbb-3(b)(1), unless the authorization is terminated or revoked sooner. Performed at [facility], [address], [place], [place] [address]      Labs: BNP (last 3 results) Recent Labs    05/13/19 1750  BNP 40.0   Basic Metabolic Panel: Recent Labs  Lab 05/13/19 1750 05/14/19 0633  NA 138 140  K 3.7 3.6  CL 103 105  CO2 25 24  GLUCOSE 135* 96  BUN 10 9  CREATININE 0.96 0.84  CALCIUM 9.5 9.0   Liver Function Tests: Recent Labs  Lab 05/14/19 0633  AST 20  ALT 23  ALKPHOS 81  BILITOT 1.7*  PROT 7.4  ALBUMIN 4.3   No results for input(s): LIPASE, AMYLASE in the last 168 hours. No results for input(s): AMMONIA in the last 168 hours. CBC: Recent Labs  Lab 05/13/19 1750 05/14/19 0633  WBC 8.7 6.2  HGB 15.9 15.6  HCT 44.7 43.4  MCV 96.1 95.6  PLT 244 247   Cardiac Enzymes: No results for input(s): CKTOTAL, CKMB, CKMBINDEX, TROPONINI in the last 168 hours. BNP: Invalid input(s): POCBNP CBG: No results for input(s): GLUCAP in the last 168 hours. D-Dimer No results for input(s): DDIMER in the last 72 hours. Hgb A1c No results for input(s): HGBA1C in the last 72 hours. Lipid Profile No results for input(s): CHOL, HDL, LDLCALC, TRIG, CHOLHDL, LDLDIRECT in the last 72 hours. Thyroid function studies Recent Labs    05/14/19 0633  TSH 1.797   Anemia work up No results for input(s): VITAMINB12, FOLATE, FERRITIN, TIBC, IRON, RETICCTPCT in the last 72 hours. Urinalysis    Component Value Date/Time   COLORURINE STRAW (A) 05/13/2019 2149   APPEARANCEUR CLEAR (A) 05/13/2019 2149   LABSPEC 1.036 (H) 05/13/2019 2149   PHURINE 6.0  05/13/2019 2149   GLUCOSEU NEGATIVE 05/13/2019 2149   HGBUR NEGATIVE 05/13/2019 2149   [place] NEGATIVE 05/13/2019 2149   KETONESUR 5 (A) 05/13/2019 2149   PROTEINUR NEGATIVE 05/13/2019 2149   NITRITE NEGATIVE 05/13/2019 2149   LEUKOCYTESUR NEGATIVE 05/13/2019 2149   Sepsis Labs Invalid input(s): PROCALCITONIN,  WBC,  LACTICIDVEN Microbiology Recent Results (from the past 240 hour(s))  SARS CORONAVIRUS 2 (TAT 6-24 HRS) Nasopharyngeal Nasopharyngeal Swab     Status: None   Collection Time: 05/14/19  1:01 AM   Specimen: Nasopharyngeal Swab  Result Value Ref Range Status   SARS Coronavirus 2 NEGATIVE NEGATIVE Final    Comment: (NOTE) SARS-CoV-2 target nucleic acids are NOT DETECTED. The SARS-CoV-2 RNA is generally detectable in upper and lower respiratory specimens during the acute phase of infection. Negative results do not preclude SARS-CoV-2 infection, do not rule out co-infections with other pathogens, and should not be used as the sole basis for treatment or other patient management decisions. Negative results must be combined with clinical observations, patient history, and epidemiological information. The expected result is Negative. Fact Sheet for Patients: SugarRoll.be Fact Sheet for Healthcare Providers: https://www.woods-mathews.com/ This test is not yet approved or cleared by the [place] FDA and  has been authorized for detection and/or diagnosis of SARS-CoV-2 by FDA under an Emergency Use Authorization (EUA). This EUA will remain  in effect (meaning this test can be used) for the duration of the COVID-19 declaration under Section 56 4(b)(1) of the Act, 21 U.S.C. section 360bbb-3(b)(1), unless the authorization is terminated or revoked sooner. Performed at [facility], [place] [address] [place], [place] [address]     Time coordinating discharge:  35 minutes.  SIGNED:  Ivor Costa, DO [facility] 05/14/2019, 4:26 PM Pager is on [place]  If 7PM-7AM, please contact night-coverage [email or web address] Password [email or web address]

## 2019-05-14 NOTE — Consult Note (Signed)
[facility] Cardiology  CARDIOLOGY CONSULT NOTE  Patient ID: Eric Mclaughlin MRN: [id number] DOB/AGE: 57/01/1961 57 y.o.  Admit date: 05/13/2019 Referring Physician Clyde LundborgNiu Primary Physician  Primary Cardiologist Nasha Diss Reason for Consultation near syncope  HPI: 58 year old gentleman referred for evaluation of near syncope.  The patient was at work yesterday, parents lightheadedness, resumed work, went home, down for a while, came very anxious, blood pressure elevated and [facility].  ECG reveals sinus rhythm at 88 bpm.  High-sensitivity troponin was normal, 3 and 5.  The patient has had no recurrent symptoms following admission.  Amatory reveals sinus rhythm at 97 bpm.  Brain MRI negative, chest CTA negative for PE, carotid ultrasound reportedly negative.  Review of systems complete and found to be negative unless listed above     Past Medical History:  Diagnosis Date  . Known health problems: none 07/04/2017  . Known health problems: none     Past Surgical History:  Procedure Laterality Date  . KNEE ARTHROSCOPY Left 1994    No medications prior to admission.   Social History   Socioeconomic History  . Marital status: Married    Spouse name: Not on file  . Number of children: Not on file  . Years of education: Not on file  . Highest education level: Not on file  Occupational History  . Not on file  Social Needs  . Financial resource strain: Not on file  . Food insecurity    Worry: Not on file    Inability: Not on file  . Transportation needs    Medical: Not on file    Non-medical: Not on file  Tobacco Use  . Smoking status: Never Smoker  . Smokeless tobacco: Never Used  Substance and Sexual Activity  . Alcohol use: Not on file  . Drug use: Not on file  . Sexual activity: Not on file  Lifestyle  . Physical activity    Days per week: Not on file    Minutes per session: Not on file  . Stress: Not on file  Relationships  . Social Wellsite geologistconnections     Talks on phone: Not on file    Gets together: Not on file    Attends religious service: Not on file    Active member of club or organization: Not on file    Attends meetings of clubs or organizations: Not on file    Relationship status: Not on file  . Intimate partner violence    Fear of current or ex partner: Not on file    Emotionally abused: Not on file    Physically abused: Not on file    Forced sexual activity: Not on file  Other Topics Concern  . Not on file  Social History Narrative  . Not on file    Family History  Problem Relation Age of Onset  . Scoliosis Mother   . Heart disease Father   . Stroke Father       Review of systems complete and found to be negative unless listed above      PHYSICAL EXAM  General: Well developed, well nourished, in no acute distress HEENT:  Normocephalic and atramatic Neck:  No JVD.  Lungs: Clear bilaterally to auscultation and percussion. Heart: HRRR . Normal S1 and S2 without gallops or murmurs.  Abdomen: Bowel sounds are positive, abdomen soft and non-tender  Msk:  Back normal, normal gait. Normal strength and tone for age. Extremities: No clubbing, cyanosis or edema.   Neuro:  Alert and oriented X 3. Psych:  Good affect, responds appropriately  Labs:   Lab Results  Component Value Date   WBC 6.2 05/14/2019   HGB 15.6 05/14/2019   HCT 43.4 05/14/2019   MCV 95.6 05/14/2019   PLT 247 05/14/2019    Recent Labs  Lab 05/14/19 0633  NA 140  K 3.6  CL 105  CO2 24  BUN 9  CREATININE 0.84  CALCIUM 9.0  PROT 7.4  BILITOT 1.7*  ALKPHOS 81  ALT 23  AST 20  GLUCOSE 96   Lab Results  Component Value Date   CKTOTAL 74 12/23/2013   CKMB 2.4 12/23/2013   TROPONINI < 0.02 12/23/2013    Lab Results  Component Value Date   CHOL 200 (H) 07/04/2017   CHOL 179 12/23/2013   Lab Results  Component Value Date   HDL 51 07/04/2017   HDL 52 12/23/2013   Lab Results  Component Value Date   LDLCALC 121 (H) 07/04/2017    LDLCALC 111 (H) 12/23/2013   Lab Results  Component Value Date   TRIG 140 07/04/2017   TRIG 79 12/23/2013   Lab Results  Component Value Date   CHOLHDL 3.9 07/04/2017   No results found for: LDLDIRECT    Radiology: Ct Angio Chest Pe W And/or Wo Contrast  Result Date: 05/13/2019 CLINICAL DATA:  Lightheadedness, palpitations, elevated blood pressure EXAM: CT ANGIOGRAPHY CHEST WITH CONTRAST TECHNIQUE: Multidetector CT imaging of the chest was performed using the standard protocol during bolus administration of intravenous contrast. Multiplanar CT image reconstructions and MIPs were obtained to evaluate the vascular anatomy. CONTRAST:  OMNIPAQUE IOHEXOL 350 MG/ML SOLN COMPARISON:  CT chest 12/23/2013 FINDINGS: Cardiovascular: Satisfactory opacification the pulmonary arteries to the segmental level. No pulmonary artery filling defects are identified. Mild dilation of the distal left main pulmonary artery similar to prior. Central pulmonary arteries are otherwise normal caliber. No elevation of the RV/LV ratio (0.66). Normal heart size. No pericardial effusion. Atherosclerotic calcification of the coronary arteries. Normal caliber of the thoracic aorta. Normal 3 vessel branching of the aortic arch. Mediastinum/Nodes: No enlarged mediastinal, hilar, or axillary lymph nodes. Thyroid gland, trachea, and esophagus demonstrate no significant findings. Lungs/Pleura: Dependent atelectasis posteriorly. No consolidation, features of edema, pneumothorax, or effusion. No suspicious pulmonary nodules or masses. Upper Abdomen: No acute abnormalities present in the visualized portions of the upper abdomen. Musculoskeletal: Multilevel degenerative changes are present in the imaged portions of the spine. Review of the MIP images confirms the above findings. IMPRESSION: 1. No evidence of pulmonary embolism. 2. No acute intrathoracic process. 3. Mild dilation of the distal left main pulmonary artery similar to prior  study, nonspecific but could suggest pulmonary arterial hypertension. 4. Aortic Atherosclerosis (ICD10-I70.0). Electronically Signed   By: Kreg Shropshire M.D.   On: 05/13/2019 23:31   Mr Brain Wo Contrast  Result Date: 05/14/2019 CLINICAL DATA:  Syncope EXAM: MRI HEAD WITHOUT CONTRAST TECHNIQUE: Multiplanar, multiecho pulse sequences of the brain and surrounding structures were obtained without intravenous contrast. COMPARISON:  None. FINDINGS: Brain: No acute infarction, hemorrhage, hydrocephalus, extra-axial collection or mass lesion. Normal cerebral white matter. Vascular: Normal arterial flow voids. Skull and upper cervical spine: No focal skeletal lesion. Sinuses/Orbits: Negative Other: None IMPRESSION: Negative MRI head Electronically Signed   By: Marlan Palau M.D.   On: 05/14/2019 09:27    EKG: Sinus rhythm at 88 bpm  ASSESSMENT AND PLAN:   1.  Near syncope, sounds vasovagal in nature, normal ECG, unremarkable  telemetry, negative chest CTA, negative brain MRI, reportedly negative carotid ultrasound, patient appears clinically hemodynamically stable  Recommendations  1.  Agree with overall current therapy 2.  Review 2D echocardiogram 3.  May discharge patient home 4.  Follow-up in 1 week for Holter monitor  Signed: Isaias Cowman MD,PhD, [facility] 05/14/2019, 10:23 AM

## 2019-05-14 NOTE — Discharge Instructions (Signed)
You were cared for by a hospitalist during your hospital stay. If you have any questions about your discharge medications or the care you received while you were in the hospital after you are discharged, you can call the unit and ask to speak with the hospitalist on call if the hospitalist that took care of you is not available. Once you are discharged, your primary care physician will handle any further medical issues. Please note that NO REFILLS for any discharge medications will be authorized once you are discharged, as it is imperative that you return to your primary care physician (or establish a relationship with a primary care physician if you do not have one) for your aftercare needs so that they can reassess your need for medications and monitor your lab values.  Follow up with cardiology. Take all medications as prescribed. If symptoms change or worsen please return to the ED for evaluation

## 2019-05-15 LAB — HEMOGLOBIN A1C
Hgb A1c MFr Bld: 4.8 % (ref 4.8–5.6)
Mean Plasma Glucose: 91.06 mg/dL

## 2019-06-15 DIAGNOSIS — I493 Ventricular premature depolarization: Secondary | ICD-10-CM | POA: Insufficient documentation

## 2020-08-16 IMAGING — US US CAROTID DUPLEX BILAT
1 series · 13 of 24 positions shown · non-contrast
Comparison: None.

CLINICAL DATA: Syncope.

EXAM:
BILATERAL CAROTID DUPLEX ULTRASOUND
TECHNIQUE: Gray scale imaging, color Doppler and duplex ultrasound were
performed of bilateral carotid and vertebral arteries in the neck.

[Series 1: us carotid duplex bilat · 13 of 66 slices shown]
[im 1/66]
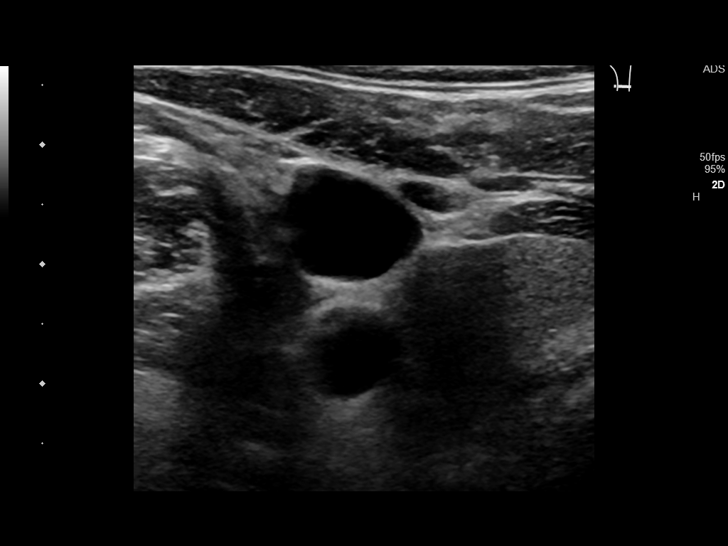
[im 6/66]
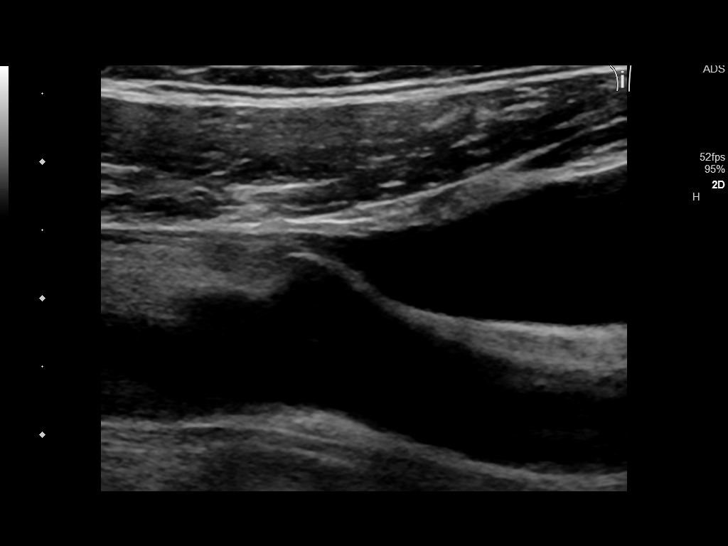
[im 12/66]
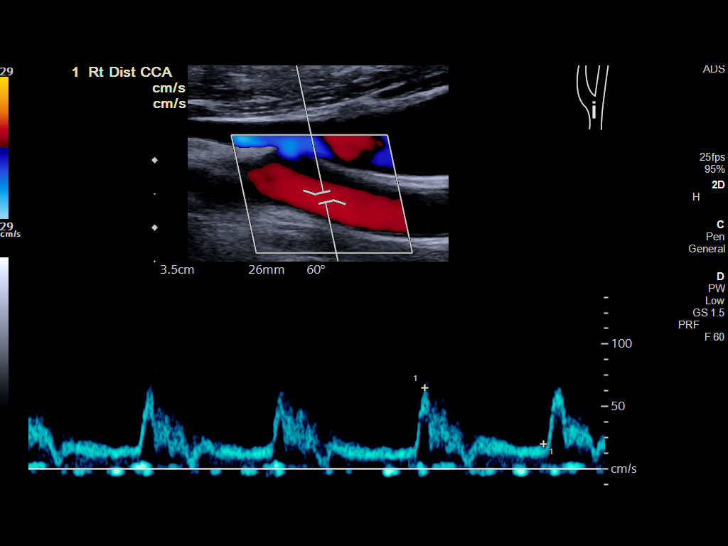
[im 17/66]
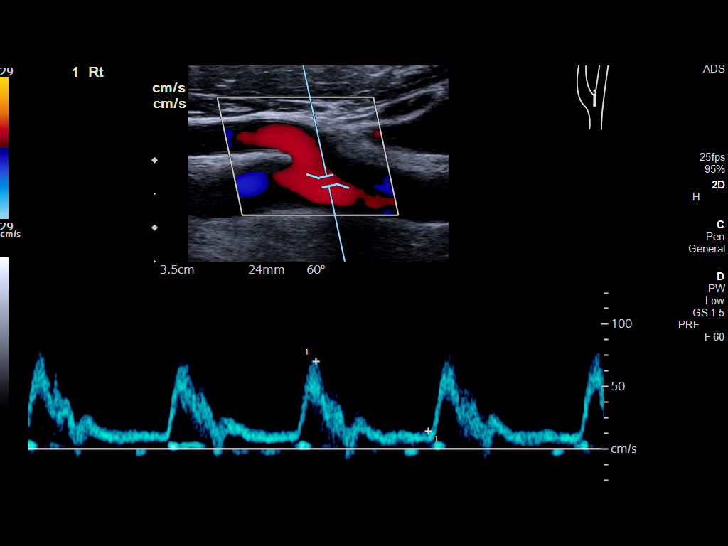
[im 23/66]
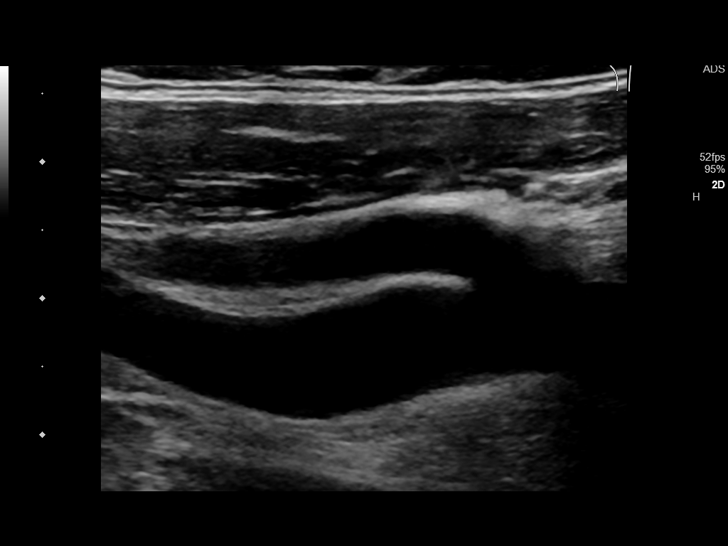
[im 29/66]
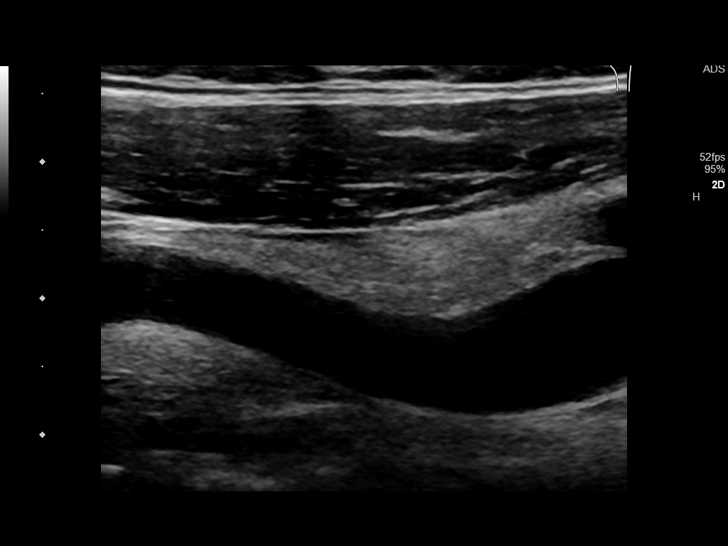
[im 34/66]
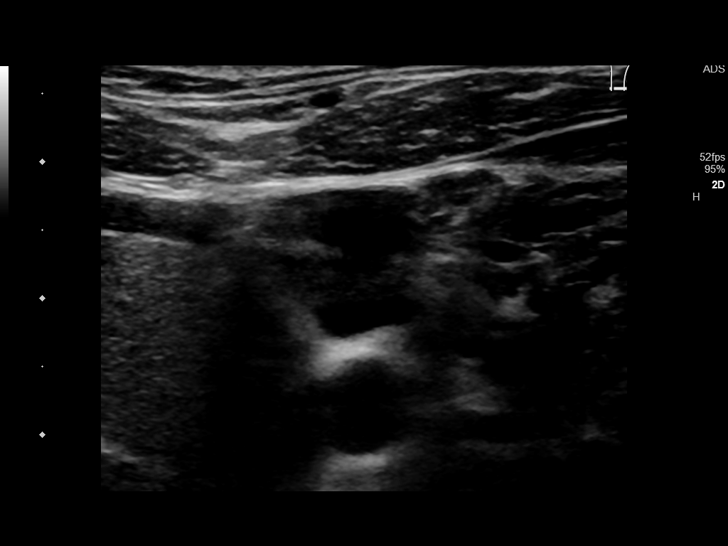
[im 37/66]
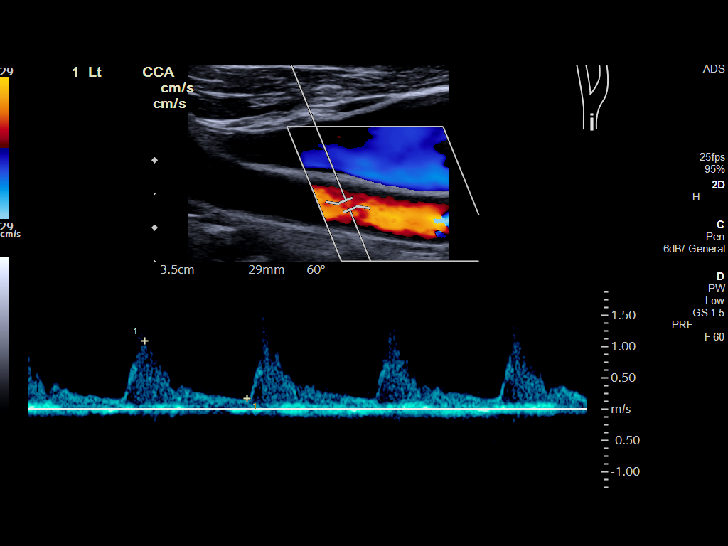
[im 43/66]
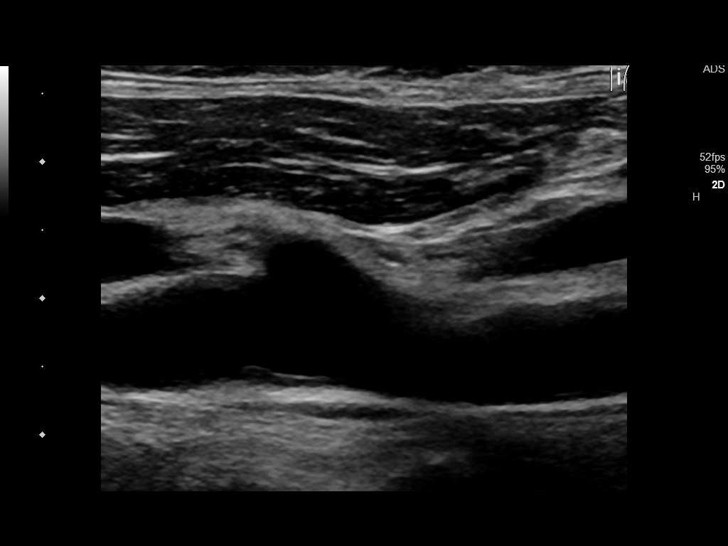
[im 49/66]
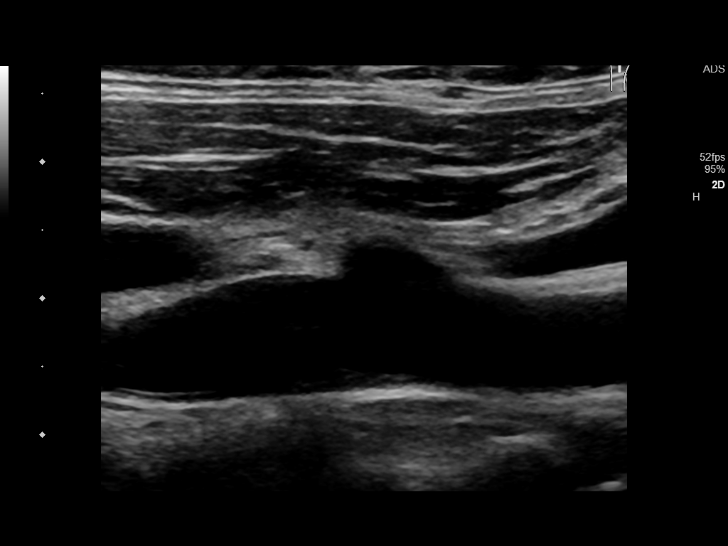
[im 54/66]
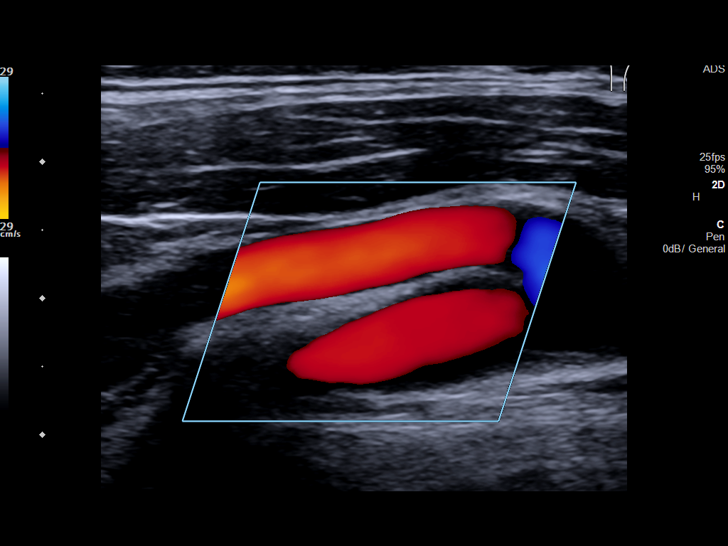
[im 60/66]
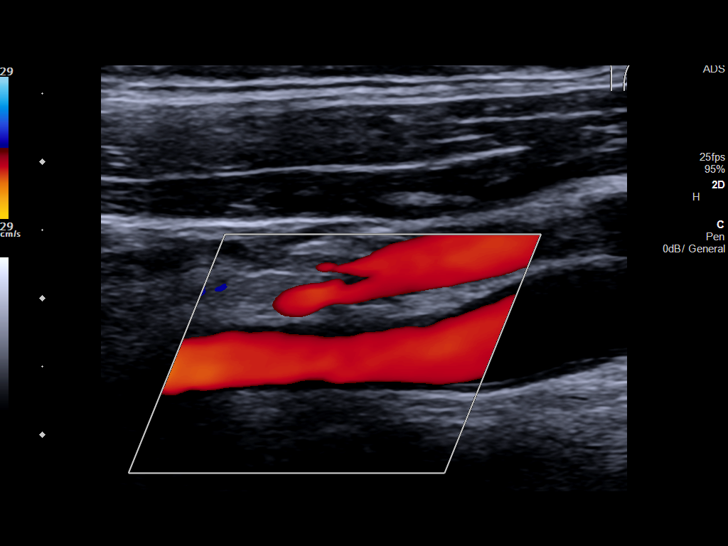
[im 66/66]
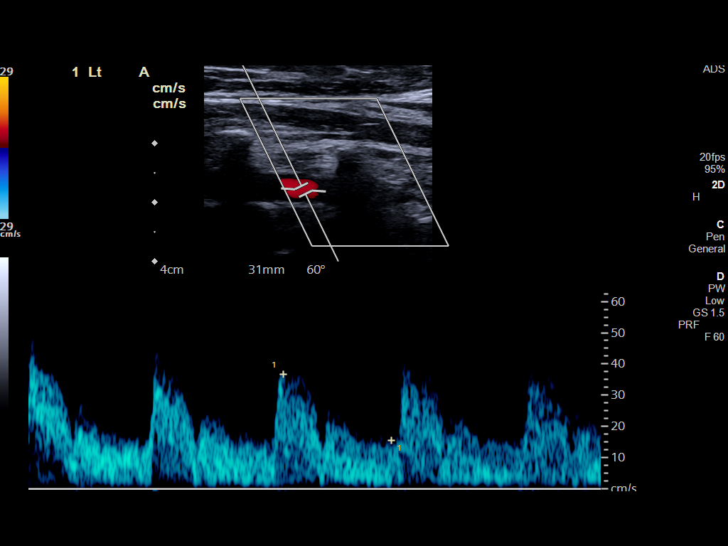

[13 of 24 positions shown; findings below may reference images not displayed]

FINDINGS: Criteria: Quantification of carotid stenosis is based on velocity
parameters that correlate the residual internal carotid diameter
with NASCET-based stenosis levels, using the diameter of the distal
internal carotid lumen as the denominator for stenosis measurement.

The following velocity measurements were obtained:

RIGHT

ICA:  69/16 cm/sec

CCA:  65/20 cm/sec

SYSTOLIC ICA/CCA RATIO:

ECA:  85 cm/sec

LEFT

ICA:  65/18 cm/sec

CCA:  90/22 cm/sec

SYSTOLIC ICA/CCA RATIO:

ECA:  110 cm/sec

RIGHT CAROTID ARTERY: No focal plaque or evidence of carotid
stenosis. Normal velocities and waveforms.

RIGHT VERTEBRAL ARTERY: Antegrade flow with normal waveform and
velocity.

LEFT CAROTID ARTERY: Minimal noncalcified plaque at the level of the
left carotid bulb. No evidence of left ICA plaque or stenosis.

LEFT VERTEBRAL ARTERY: Antegrade flow with normal waveform and
velocity.
IMPRESSION: No evidence of bilateral ICA plaque or stenosis. Minimal plaque at
the level of the left carotid bulb.

## 2021-04-12 ENCOUNTER — Emergency Department
Admission: EM | Admit: 2021-04-12 | Discharge: 2021-04-12 | Disposition: A | Discharge status: Home / Self Care | Payer: Managed Care, Other (non HMO) | Attending: Student in an Organized Health Care Education/Training Program | Admitting: Student in an Organized Health Care Education/Training Program

## 2021-04-12 DIAGNOSIS — T7840XA Allergy, unspecified, initial encounter: Secondary | ICD-10-CM | POA: Diagnosis present

## 2021-04-12 MED ORDER — PREDNISONE 10 MG PO TABS: 10.0000 mg | ORAL_TABLET | Freq: Every day | ORAL | 0 refills | Status: DC

## 2021-04-12 MED ORDER — FAMOTIDINE IN NACL 20-0.9 MG/50ML-% IV SOLN
20.0000 mg | Freq: Once | INTRAVENOUS | Status: AC
  Administered 2021-04-12: 20 mg via INTRAVENOUS
  Filled 2021-04-12: qty 50

## 2021-04-12 MED ORDER — METHYLPREDNISOLONE SODIUM SUCC 125 MG IJ SOLR
125.0000 mg | Freq: Once | INTRAMUSCULAR | Status: AC
  Administered 2021-04-12: 125 mg via INTRAVENOUS
  Filled 2021-04-12: qty 2

## 2021-04-12 NOTE — Discharge Instructions (Addendum)
Please continue with Benadryl tonight at bedtime and if any symptoms such as swelling, pain, redness or itching tomorrow he may start steroid taper.  Return to the ER for any difficulty breathing, severe facial swelling, worsening symptoms or to changes in

## 2021-04-12 NOTE — ED Notes (Signed)
Patient discharged to home per MD order. Patient in stable condition, and deemed medically cleared by ED provider for discharge. Discharge instructions reviewed with patient/family using "Teach Back"; verbalized understanding of medication education and administration, and information about follow-up care. Denies further concerns. ° °

## 2021-04-12 NOTE — ED Provider Notes (Signed)
Emergency Medicine Provider Triage Evaluation Note  Eric Mclaughlin , a 60 y.o. male  was evaluated in triage.  Pt complains of left-sided facial swelling following a sting to the left side of his face.  Patient states something stung him on the left side of the face as he was mowing.  He developed left-sided facial swelling around the left eye.  He states his lips felt swollen but no difficulty swallowing, breathing.  No chest pain or shortness of breath.  No history of anaphylaxis.  He did take Benadryl at 5:30 PM today.  Following the accident..  Review of Systems  Positive: Facial swelling Negative: Chest pain, shortness of breath  Physical Exam  There were no vitals taken for this visit. Gen:   Awake, no distress left-sided facial swelling Resp:  Normal effort, no wheezing rales or rhonchi MSK:   Moves extremities without difficulty Other:  No pharyngeal erythema, swelling.  Medical Decision Making  Medically screening exam initiated at 6:43 PM.  Appropriate orders placed.  Rochele Raring was informed that the remainder of the evaluation will be completed by another provider, this initial triage assessment does not replace that evaluation, and the importance of remaining in the ED until their evaluation is complete.  Start IV, give Solu-Medrol, Pepcid and continue to monitor.   Evon Slack, PA-C 04/12/21 1845    Willy Eddy, MD 04/12/21 [place]

## 2021-04-12 NOTE — ED Provider Notes (Signed)
[facility] EMERGENCY DEPARTMENT Provider Note   CSN: [id number] Arrival date & time: 04/12/21  1823     History Chief Complaint  Patient presents with   Allergic Reaction    Eric Mclaughlin is a 60 y.o. male.  Presents to the emergency department for evaluation of allergic reaction.  Patient states he was stung earlier today around 5:00.  5:30 PM took Benadryl continued to have left-sided facial swelling.  No chest pain or shortness of breath.  He was evaluated in triage and given IV medications consisting of Pepcid, Solu-Medrol and saw significant improvement in symptoms.  Patient 90% better.  HPI     Past Medical History:  Diagnosis Date   Known health problems: none 07/04/2017   Known health problems: none     Patient Active Problem List   Diagnosis Date Noted   Near syncope 05/14/2019   Tachycardia 05/14/2019   Elevated blood pressure reading 05/14/2019    Past Surgical History:  Procedure Laterality Date   KNEE ARTHROSCOPY Left 1994       Family History  Problem Relation Age of Onset   Scoliosis Mother    Heart disease Father    Stroke Father     Social History   Tobacco Use   Smoking status: Never   Smokeless tobacco: Never    Home Medications Prior to Admission medications   Medication Sig Start Date End Date Taking? Authorizing Provider  predniSONE (DELTASONE) 10 MG tablet Take 1 tablet (10 mg total) by mouth daily. 6,5,4,3,2,1 six day taper 04/12/21  Yes Evon Slack, PA-C    Allergies    Wasp venom and Amoxicillin  Review of Systems   Review of Systems  Constitutional:  Negative for chills and fever.  HENT:  Negative for sore throat, trouble swallowing and voice change.   Eyes:  Negative for pain.  Respiratory:  Negative for choking, chest tightness and shortness of breath.   Cardiovascular:  Negative for chest pain.  Skin:  Positive for rash. Negative for color change.   Physical Exam Updated Vital  Signs BP (!) 164/100 (BP Location: Right Arm)   Pulse 75   Temp 97.7 F (36.5 C) (Oral)   Resp 20   SpO2 100%   Physical Exam Constitutional:      Appearance: He is well-developed.  HENT:     Head: Normocephalic and atraumatic.     Comments: Left-sided facial swelling.  Patient was stung along the medial aspect of left outer nose.  Swelling under the left eye and to the left cheek and left lip.  No pharyngeal or intraoral swelling.    Right Ear: Ear canal and external ear normal.     Left Ear: Ear canal and external ear normal.     Mouth/Throat:     Pharynx: No oropharyngeal exudate or posterior oropharyngeal erythema.  Eyes:     Conjunctiva/sclera: Conjunctivae normal.  Cardiovascular:     Rate and Rhythm: Normal rate.  Pulmonary:     Effort: Pulmonary effort is normal. No respiratory distress.     Breath sounds: Normal breath sounds. No wheezing or rales.  Musculoskeletal:        General: Normal range of motion.     Cervical back: Normal range of motion.  Skin:    General: Skin is warm.     Findings: No rash.  Neurological:     Mental Status: He is alert and oriented to person, place, and time.  Psychiatric:  Behavior: Behavior normal.        Thought Content: Thought content normal.    ED Results / Procedures / Treatments   Labs (all labs ordered are listed, but only abnormal results are displayed) Labs Reviewed - No data to display  EKG None  Radiology No results found.  Procedures Procedures   Medications Ordered in ED Medications  methylPREDNISolone sodium succinate (SOLU-MEDROL) 125 mg/2 mL injection 125 mg (125 mg Intravenous Given 04/12/21 1854)  famotidine (PEPCID) IVPB 20 mg premix (0 mg Intravenous Stopped 04/12/21 1924)    ED Course  I have reviewed the triage vital signs and the nursing notes.  Pertinent labs & imaging results that were available during my care of the patient were reviewed by me and considered in my medical decision  making (see chart for details).    MDM Rules/Calculators/A&P                         60 year old male with allergic reaction from insect bite/sting.  Patient with moderate facial swelling around site of the sting.  Symptoms 90% better after IV medications.  He will continue with Benadryl at home.  He is given a prednisone taper to start tomorrow if he continues to have some mild symptoms.  He understands signs and symptoms return to the ER for. Final Clinical Impression(s) / ED Diagnoses Final diagnoses:  Allergic reaction, initial encounter    Rx / DC Orders ED Discharge Orders          Ordered    predniSONE (DELTASONE) 10 MG tablet  Daily        04/12/21 2118             Ronnette Juniper 04/12/21 2124    Willy Eddy, MD 04/12/21 2142

## 2021-04-12 NOTE — ED Triage Notes (Signed)
Pt presents to c/o of getting stung by a bee PTA. Pt does have swelling to L eye and states he was stung on the L side of his nose. Pt states "I swell up" but denies any anaphylaxis with bee stings. Pt states his lips are tingling but also mentions some SOB. Pt is A&Ox4.     Pt took 50 mg of benadryl at 1735.

## 2021-05-15 ENCOUNTER — Encounter: Payer: Self-pay | Admitting: Emergency Medicine

## 2021-05-15 ENCOUNTER — Other Ambulatory Visit: Payer: Self-pay

## 2021-05-15 ENCOUNTER — Ambulatory Visit
Admission: EM | Admit: 2021-05-15 | Discharge: 2021-05-15 | Disposition: A | Discharge status: Home / Self Care | Payer: Managed Care, Other (non HMO) | Attending: Emergency Medicine | Admitting: Emergency Medicine

## 2021-05-15 DIAGNOSIS — U071 COVID-19: Secondary | ICD-10-CM | POA: Diagnosis not present

## 2021-05-15 MED ORDER — MOLNUPIRAVIR 200 MG PO CAPS: 4.0000 | ORAL_CAPSULE | Freq: Two times a day (BID) | ORAL | 0 refills | Status: AC

## 2021-05-15 NOTE — Discharge Instructions (Signed)
Take the antiviral twice a day for the next 5 days  You may use any over-the-counter medications as listed below if any symptoms may arise    You can take Tylenol and/or Ibuprofen as needed for fever reduction and pain relief.   For cough: honey 1/2 to 1 teaspoon (you can dilute the honey in water or another fluid).  You can also use guaifenesin and dextromethorphan for cough. You can use a humidifier for chest congestion and cough.  If you don't have a humidifier, you can sit in the bathroom with the hot shower running.      For sore throat: try warm salt water gargles, cepacol lozenges, throat spray, warm tea or water with lemon/honey, popsicles or ice, or OTC cold relief medicine for throat discomfort.   For congestion: take a daily anti-histamine like Zyrtec, Claritin, and a oral decongestant, such as pseudoephedrine.  You can also use Flonase 1-2 sprays in each nostril daily.   It is important to stay hydrated: drink plenty of fluids (water, gatorade/powerade/pedialyte, juices, or teas) to keep your throat moisturized and help further relieve irritation/discomfort.

## 2021-05-15 NOTE — ED Triage Notes (Signed)
Pt reports that he tested positive for Covid today. His symptoms began 4 days ago but have since subsided. His wife and daughter both tested positive. Wife was given antiviral and patient wants to know if he needs to take the antivirals as well.

## 2021-05-15 NOTE — ED Provider Notes (Signed)
[facility]    CSN: [id number] Arrival date & time: 05/15/21  1736      History   Chief Complaint Chief Complaint  Patient presents with   Covid Positive    HPI Eric Mclaughlin is a 60 y.o. male.   Patient presents with nasal congestion, rhinorrhea, nonproductive cough, intermittent generalized headaches and intermittent diarrhea for 4 days.  Home COVID test positive.  Has attempted use of cold and flu which was somewhat helpful. Known sick contacts.  Requesting use of antivirals.  History of PVCs, syncope, GERD and obesity.  Past Medical History:  Diagnosis Date   Known health problems: none 07/04/2017   Known health problems: none     Patient Active Problem List   Diagnosis Date Noted   Near syncope 05/14/2019   Tachycardia 05/14/2019   Elevated blood pressure reading 05/14/2019    Past Surgical History:  Procedure Laterality Date   KNEE ARTHROSCOPY Left 1994       Home Medications    Prior to Admission medications   Medication Sig Start Date End Date Taking? Authorizing Provider  predniSONE (DELTASONE) 10 MG tablet Take 1 tablet (10 mg total) by mouth daily. 6,5,4,3,2,1 six day taper 04/12/21   Evon Slack, PA-C    Family History Family History  Problem Relation Age of Onset   Scoliosis Mother    Heart disease Father    Stroke Father     Social History Social History   Tobacco Use   Smoking status: Never   Smokeless tobacco: Never  Substance Use Topics   Alcohol use: Yes    Comment: occ   Drug use: Never     Allergies   Wasp venom and Amoxicillin   Review of Systems Review of Systems  Constitutional:  Negative for activity change, appetite change, chills, diaphoresis, fatigue, fever and unexpected weight change.  HENT:  Positive for congestion and rhinorrhea. Negative for dental problem, drooling, ear discharge, ear pain, facial swelling, hearing loss, mouth sores, nosebleeds, postnasal drip, sinus pressure, sinus  pain, sneezing, sore throat, tinnitus, trouble swallowing and voice change.   Respiratory:  Positive for cough. Negative for apnea, choking, chest tightness, shortness of breath, wheezing and stridor.   Cardiovascular: Negative.  Negative for palpitations.  Gastrointestinal:  Positive for diarrhea. Negative for abdominal distention, abdominal pain, anal bleeding, blood in stool, constipation, nausea, rectal pain and vomiting.  Skin: Negative.   Neurological:  Positive for headaches. Negative for dizziness, tremors, seizures, syncope, facial asymmetry, speech difficulty, light-headedness and numbness.    Physical Exam Triage Vital Signs ED Triage Vitals  Enc Vitals Group     BP 05/15/21 1924 (!) 163/111     Pulse Rate 05/15/21 1924 74     Resp 05/15/21 1924 16     Temp 05/15/21 1924 98.3 F (36.8 C)     Temp Source 05/15/21 1924 Oral     SpO2 05/15/21 1924 99 %     Weight --      Height --      Head Circumference --      Peak Flow --      Pain Score 05/15/21 1922 0     Pain Loc --      Pain Edu? --      Excl. in GC? --    No data found.  Updated Vital Signs BP (!) 163/111 (BP Location: Right Arm)   Pulse 74   Temp 98.3 F (36.8 C) (Oral)   Resp 16  SpO2 99%   Visual Acuity Right Eye Distance:   Left Eye Distance:   Bilateral Distance:    Right Eye Near:   Left Eye Near:    Bilateral Near:     Physical Exam Constitutional:      Appearance: Normal appearance. He is normal weight.  HENT:     Head: Normocephalic.     Right Ear: Tympanic membrane, ear canal and external ear normal.     Left Ear: Tympanic membrane, ear canal and external ear normal.     Nose: Congestion and rhinorrhea present.     Mouth/Throat:     Mouth: Mucous membranes are moist.     Pharynx: Oropharynx is clear.  Eyes:     Extraocular Movements: Extraocular movements intact.  Cardiovascular:     Rate and Rhythm: Normal rate and regular rhythm.     Pulses: Normal pulses.     Heart sounds:  Normal heart sounds.  Pulmonary:     Effort: Pulmonary effort is normal.     Breath sounds: Normal breath sounds.  Musculoskeletal:     Cervical back: Normal range of motion and neck supple.  Skin:    General: Skin is warm and dry.  Neurological:     Mental Status: He is alert and oriented to person, place, and time. Mental status is at baseline.  Psychiatric:        Mood and Affect: Mood normal.        Behavior: Behavior normal.     UC Treatments / Results  Labs (all labs ordered are listed, but only abnormal results are displayed) Labs Reviewed - No data to display  EKG   Radiology No results found.  Procedures Procedures (including critical care time)  Medications Ordered in UC Medications - No data to display  Initial Impression / Assessment and Plan / UC Course  I have reviewed the triage vital signs and the nursing notes.  Pertinent labs & imaging results that were available during my care of the patient were reviewed by me and considered in my medical decision making (see chart for details).  COVID-19  1. Molnupiravir 800 mg twice daily for 5 days 2.  Over-the-counter medications for remaining symptom management 3.  Urgent care follow-up as needed Final Clinical Impressions(s) / UC Diagnoses   Final diagnoses:  None   Discharge Instructions   None    ED Prescriptions   None    PDMP not reviewed this encounter.   Valinda Hoar, [place] 05/17/21 [phone]

## 2021-11-16 ENCOUNTER — Ambulatory Visit: Payer: Self-pay | Admitting: *Deleted

## 2021-11-16 NOTE — Telephone Encounter (Signed)
  Chief Complaint: lightheaded yesterday and felt like passing out  Symptoms: none now. Lightheaded , "felt a whoosh" feeling inside heart beating fast. Stress at work Frequency: yesterday  Pertinent Negatives: Patient denies chest pain no difficulty breathing , no sx now reports eating and drinking are good  Disposition: [x] ED /[] Urgent Care (no appt availability in office) / [] Appointment(In office/virtual)/ []  [facility]/ [] Home Care/ [] Refused Recommended Disposition /[] [facility]/ []  Follow-up with PCP Additional Notes:   No PCP. Recommended UC/ ED for evaluation esp if occurs again. Requesting a new patient appt with Odette Fraction, NP at [facility] .   Reason for Disposition  [1] MILD dizziness (e.g., walking normally) AND [2] has NOT been evaluated by physician for this  (Exception: dizziness caused by heat exposure, sudden standing, or poor fluid intake)  Answer Assessment - Initial Assessment Questions 1. DESCRIPTION: "Describe your dizziness."     lightheaded 2. LIGHTHEADED: "Do you feel lightheaded?" (e.g., somewhat faint, woozy, weak upon standing)     Not now happened yesterday  3. VERTIGO: "Do you feel like either you or the room is spinning or tilting?" (i.e. vertigo)     no 4. SEVERITY: "How bad is it?"  "Do you feel like you are going to faint?" "Can you stand and walk?"   - MILD: Feels slightly dizzy, but walking normally.   - MODERATE: Feels unsteady when walking, but not falling; interferes with normal activities (e.g., school, work).   - SEVERE: Unable to walk without falling, or requires assistance to walk without falling; feels like passing out now.      No lightheadedness now  5. ONSET:  "When did the dizziness begin?"     Yesterday  6. AGGRAVATING FACTORS: "Does anything make it worse?" (e.g., standing, change in head position)     Stress at work 7. HEART RATE: "Can you tell me your heart rate?" "How many beats in 15 seconds?"   (Note: not all patients can do this)       na 8. CAUSE: "What do you think is causing the dizziness?"     Stress from work  9. RECURRENT SYMPTOM: "Have you had dizziness before?" If Yes, ask: "When was the last time?" "What happened that time?"     Yes last year  10. OTHER SYMPTOMS: "Do you have any other symptoms?" (e.g., fever, chest pain, vomiting, diarrhea, bleeding)       No  11. PREGNANCY: "Is there any chance you are pregnant?" "When was your last menstrual period?"       na  Protocols used: Dizziness - Lightheadedness-A-AH

## 2022-05-27 DIAGNOSIS — E559 Vitamin D deficiency, unspecified: Secondary | ICD-10-CM | POA: Insufficient documentation

## 2022-05-27 DIAGNOSIS — E7800 Pure hypercholesterolemia, unspecified: Secondary | ICD-10-CM | POA: Insufficient documentation

## 2022-05-27 DIAGNOSIS — E669 Obesity, unspecified: Secondary | ICD-10-CM | POA: Insufficient documentation

## 2022-05-27 NOTE — Patient Instructions (Signed)

## 2022-05-28 ENCOUNTER — Ambulatory Visit: Payer: Managed Care, Other (non HMO) | Admitting: Nurse Practitioner

## 2022-05-28 ENCOUNTER — Encounter: Payer: Self-pay | Admitting: Nurse Practitioner

## 2022-05-28 VITALS — BP 142/90 | HR 109 | Temp 97.7°F | Ht 71.0 in | Wt 243.9 lb

## 2022-05-28 DIAGNOSIS — B351 Tinea unguium: Secondary | ICD-10-CM

## 2022-05-28 DIAGNOSIS — I493 Ventricular premature depolarization: Secondary | ICD-10-CM

## 2022-05-28 DIAGNOSIS — E6609 Other obesity due to excess calories: Secondary | ICD-10-CM

## 2022-05-28 DIAGNOSIS — R000 Tachycardia, unspecified: Secondary | ICD-10-CM | POA: Diagnosis not present

## 2022-05-28 DIAGNOSIS — I10 Essential (primary) hypertension: Secondary | ICD-10-CM | POA: Diagnosis not present

## 2022-05-28 DIAGNOSIS — Z6834 Body mass index (BMI) 34.0-34.9, adult: Secondary | ICD-10-CM

## 2022-05-28 DIAGNOSIS — H6123 Impacted cerumen, bilateral: Secondary | ICD-10-CM

## 2022-05-28 DIAGNOSIS — E559 Vitamin D deficiency, unspecified: Secondary | ICD-10-CM

## 2022-05-28 DIAGNOSIS — Z1159 Encounter for screening for other viral diseases: Secondary | ICD-10-CM

## 2022-05-28 DIAGNOSIS — N400 Enlarged prostate without lower urinary tract symptoms: Secondary | ICD-10-CM

## 2022-05-28 DIAGNOSIS — E7800 Pure hypercholesterolemia, unspecified: Secondary | ICD-10-CM | POA: Diagnosis not present

## 2022-05-28 DIAGNOSIS — Z23 Encounter for immunization: Secondary | ICD-10-CM | POA: Diagnosis not present

## 2022-05-28 DIAGNOSIS — Z7689 Persons encountering health services in other specified circumstances: Secondary | ICD-10-CM

## 2022-05-28 MED ORDER — CICLOPIROX 8 % EX SOLN: Freq: Every day | CUTANEOUS | 1 refills | Status: DC

## 2022-05-28 MED ORDER — VALSARTAN 40 MG PO TABS: 40.0000 mg | ORAL_TABLET | Freq: Every day | ORAL | 12 refills | Status: DC

## 2022-05-28 NOTE — Assessment & Plan Note (Addendum)
Ongoing with elevations often above goal >130/90 on home readings.  Discussed with patient and he is in agreeance with starting medication.  Will start Valsartan 40 MG daily and adjust as needed, educated him on this medication and use + side effects to monitor for.  Recommend she monitor BP at least a few mornings a week at home and document.  DASH diet at home.  Labs today: CBC, CMP, TSH, urine ALB.  Avoid Ibuprofen use and cut back on any alcohol intake.  Return in 4 weeks.

## 2022-05-28 NOTE — Assessment & Plan Note (Signed)
BMI 34.02.  Recommended eating smaller high protein, low fat meals more frequently and exercising 30 mins a day 5 times a week with a goal of 10-15lb weight loss in the next 3 months. Patient voiced their understanding and motivation to adhere to these recommendations.

## 2022-05-28 NOTE — Assessment & Plan Note (Signed)
Chronic, stable.  Followed by cardiology, continue this collaboration, recent notes reviewed. 

## 2022-05-28 NOTE — Assessment & Plan Note (Signed)
History of low levels, with supplement taken.  Check today and adjust as needed.

## 2022-05-28 NOTE — Progress Notes (Addendum)
New Patient Office Visit  Subjective    Patient ID: Eric Mclaughlin, male    DOB: [birth year]  Age: 61 y.o. MRN: [id number]  CC:  Chief Complaint  Patient presents with   New Patient (Initial Visit)    Becomes lighted headed, has been keeping his BP readings. Patient also states that he has a bad right knee    HPI BENTLEIGH KRENGEL presents for new patient visit to establish care.  Introduced to Designer, jewellery role and practice setting.  All questions answered.  Discussed provider/patient relationship and expectations.  Has not had a primary care provider in 15-20 years.    HYPERTENSION & TACHYCARDIA Sees cardiology, last visit on 05/22/22 with no current medications.  Last labs in 2020.  Has been having some dizziness on occasion.  Has occasional PVCs per baseline. Hypertension status: uncontrolled  Duration of hypertension: chronic BP monitoring frequency:  a few times a week BP range: 127/77 to 155/103 -- on average 140-150/80-90 Aspirin: no Recurrent headaches: no Visual changes: no Palpitations: no Dyspnea: no Chest pain: no Lower extremity edema: no Dizzy/lightheaded:  occasional  -- especially with position changes The ASCVD Risk score (Arnett DK, et al., 2019) failed to calculate for the following reasons:   Cannot find a previous HDL lab   Cannot find a previous total cholesterol lab  TOE DISCOLORATION: History of hitting left 4th toe on bed this year and breaking it.  Currently with discoloration to first 3 toes on left foot, has been present for 3-4 weeks.  He is concerned for fungal infection to toes.  No issues to right foot present.       05/28/2022    2:54 PM 05/28/2022    2:10 PM  Depression screen PHQ 2/9  Decreased Interest 0 0  Down, Depressed, Hopeless 0 0  PHQ - 2 Score 0 0  Altered sleeping 0   Tired, decreased energy 0   Change in appetite 0   Feeling bad or failure about yourself  0   Trouble concentrating 0   Moving slowly or  fidgety/restless 0   Suicidal thoughts 0   PHQ-9 Score 0   Difficult doing work/chores Not difficult at all        05/28/2022    2:55 PM  GAD 7 : Generalized Anxiety Score  Nervous, Anxious, on Edge 1  Control/stop worrying 0  Worry too much - different things 0  Trouble relaxing 0  Restless 0  Easily annoyed or irritable 0  Afraid - awful might happen 0  Total GAD 7 Score 1  Anxiety Difficulty Not difficult at all   Outpatient Encounter Medications as of 05/28/2022  Medication Sig   ciclopirox (PENLAC) 8 % solution Apply topically at bedtime. Apply over nail and surrounding skin. Apply daily over previous coat. After seven (7) days, may remove with alcohol and continue cycle.   valsartan (DIOVAN) 40 MG tablet Take 1 tablet (40 mg total) by mouth daily.   No facility-administered encounter medications on file as of 05/28/2022.    Past Medical History:  Diagnosis Date   Allergy    Anxiety    GERD (gastroesophageal reflux disease)    Known health problems: none 07/04/2017   Known health problems: none    Oxygen deficiency    Sleep apnea     Past Surgical History:  Procedure Laterality Date   FRACTURE SURGERY     KNEE ARTHROSCOPY Left 1994    Family History  Problem Relation Age  of Onset   Scoliosis Mother    Heart disease Father    Stroke Father     Social History   Socioeconomic History   Marital status: Married    Spouse name: Not on file   Number of children: Not on file   Years of education: Not on file   Highest education level: Not on file  Occupational History   Not on file  Tobacco Use   Smoking status: Never    Passive exposure: Past   Smokeless tobacco: Never  Vaping Use   Vaping Use: Never used  Substance and Sexual Activity   Alcohol use: Not Currently    Alcohol/week: 16.0 standard drinks of alcohol    Types: 14 Cans of beer, 2 Shots of liquor per week    Comment: occ   Drug use: Never   Sexual activity: Not Currently  Other Topics  Concern   Not on file  Social History Narrative   Not on file   Social Determinants of Health   Financial Resource Strain: Low Risk  (05/28/2022)   Overall Financial Resource Strain (CARDIA)    Difficulty of Paying Living Expenses: Not hard at all  Food Insecurity: No Food Insecurity (05/28/2022)   Hunger Vital Sign    Worried About Running Out of Food in the Last Year: Never true    [place] in the Last Year: Never true  Transportation Needs: No Transportation Needs (05/28/2022)   PRAPARE - Hydrologist (Medical): No    Lack of Transportation (Non-Medical): No  Physical Activity: Sufficiently Active (05/28/2022)   Exercise Vital Sign    Days of Exercise per Week: 5 days    Minutes of Exercise per Session: 30 min  Stress: No Stress Concern Present (05/28/2022)   [place]    Feeling of Stress : Not at all  Social Connections: Moderately Isolated (05/28/2022)   Social Connection and Isolation Panel [NHANES]    Frequency of Communication with Friends and Family: More than three times a week    Frequency of Social Gatherings with Friends and Family: More than three times a week    Attends Religious Services: Never    Marine scientist or Organizations: No    Attends Archivist Meetings: Never    Marital Status: Married  Human resources officer Violence: Not At Risk (05/28/2022)   Humiliation, Afraid, Rape, and Kick questionnaire    Fear of Current or Ex-Partner: No    Emotionally Abused: No    Physically Abused: No    Sexually Abused: No   Functional Status Survey: Is the patient deaf or have difficulty hearing?: No Does the patient have difficulty seeing, even when wearing glasses/contacts?: No Does the patient have difficulty concentrating, remembering, or making decisions?: No Does the patient have difficulty walking or climbing stairs?: No Does the patient have  difficulty dressing or bathing?: No Does the patient have difficulty doing errands alone such as visiting a doctor's office or shopping?: No      05/14/2019   12:57 AM 05/14/2019    6:00 AM 05/14/2019    7:53 AM 05/28/2022    1:46 PM 05/28/2022    2:07 PM  Fall Risk  Falls in the past year?    0 0  Was there an injury with Fall?    0 0  Fall Risk Category Calculator    0 0  Fall Risk Category  Low Low  Patient Fall Risk Level Low fall risk Low fall risk Moderate fall risk  Low fall risk  Patient at Risk for Falls Due to    No Fall Risks No Fall Risks  Fall risk Follow up    Falls evaluation completed Falls prevention discussed    Review of Systems  Constitutional:  Negative for chills, diaphoresis, fever and malaise/fatigue.  Respiratory: Negative.    Cardiovascular:  Negative for chest pain, palpitations, leg swelling and PND.  Gastrointestinal: Negative.   Neurological:  Positive for dizziness. Negative for tremors, seizures, weakness and headaches.  Psychiatric/Behavioral: Negative.        Objective    BP (!) 142/90 (BP Location: Left Arm, Patient Position: Sitting, Cuff Size: Normal)   Pulse (!) 109   Temp 97.7 F (36.5 C) (Oral)   Ht 5\' 11"  (1.803 m)   Wt 243 lb 14.4 oz (110.6 kg)   SpO2 98%   BMI 34.02 kg/m   Physical Exam Vitals and nursing note reviewed.  Constitutional:      General: He is awake. He is not in acute distress.    Appearance: He is well-developed and well-groomed. He is obese. He is not ill-appearing or toxic-appearing.  HENT:     Head: Normocephalic.     Right Ear: Hearing, ear canal and external ear normal. There is impacted cerumen.     Left Ear: Hearing, ear canal and external ear normal. There is impacted cerumen.     Ears:     Comments: Impacted cerumen -- R>L, ears irrigated bilaterally by CMA and overall clearance present. Tolerated well.    Nose: Nose normal.     Mouth/Throat:     Mouth: Mucous membranes are moist.     Pharynx:  Oropharynx is clear.  Eyes:     General: Lids are normal.     Extraocular Movements: Extraocular movements intact.     Conjunctiva/sclera: Conjunctivae normal.  Neck:     Thyroid: No thyromegaly.     Vascular: No carotid bruit.  Cardiovascular:     Rate and Rhythm: Normal rate and regular rhythm.     Pulses:          Dorsalis pedis pulses are 2+ on the right side and 2+ on the left side.       Posterior tibial pulses are 2+ on the right side and 2+ on the left side.     Heart sounds: Normal heart sounds.  Pulmonary:     Effort: No accessory muscle usage or respiratory distress.     Breath sounds: Normal breath sounds.  Abdominal:     General: Bowel sounds are normal. There is no distension.     Palpations: Abdomen is soft.     Tenderness: There is no abdominal tenderness.  Musculoskeletal:     Cervical back: Full passive range of motion without pain.     Right lower leg: No edema.     Left lower leg: No edema.  Feet:     Right foot:     Protective Sensation: 10 sites tested.  10 sites sensed.     Skin integrity: Skin integrity normal.     Toenail Condition: Right toenails are normal.     Left foot:     Protective Sensation: 10 sites tested.  10 sites sensed.     Toenail Condition: Fungal disease present. Lymphadenopathy:     Cervical: No cervical adenopathy.  Skin:    General: Skin is warm.  Capillary Refill: Capillary refill takes less than 2 seconds.  Neurological:     Mental Status: He is alert and oriented to person, place, and time.     Deep Tendon Reflexes: Reflexes are normal and symmetric.     Reflex Scores:      Brachioradialis reflexes are 2+ on the right side and 2+ on the left side.      Patellar reflexes are 2+ on the right side and 2+ on the left side. Psychiatric:        Attention and Perception: Attention normal.        Mood and Affect: Mood normal.        Speech: Speech normal.        Behavior: Behavior normal. Behavior is cooperative.         Thought Content: Thought content normal.       Assessment & Plan:   Problem List Items Addressed This Visit       Cardiovascular and Mediastinum   Essential hypertension - Primary    Ongoing with elevations often above goal >130/90 on home readings.  Discussed with patient and he is in agreeance with starting medication.  Will start Valsartan 40 MG daily and adjust as needed, educated him on this medication and use + side effects to monitor for.  Recommend she monitor BP at least a few mornings a week at home and document.  DASH diet at home.  Labs today: CBC, CMP, TSH, urine ALB.  Avoid Ibuprofen use and cut back on any alcohol intake.  Return in 4 weeks.       Relevant Medications   valsartan (DIOVAN) 40 MG tablet   Other Relevant Orders   CBC with Differential/Platelet   Comprehensive metabolic panel   Lipid Panel w/o Chol/HDL Ratio   TSH   Microalbumin, Urine Waived   Premature ventricular contractions    Chronic, stable.  Followed by cardiology, continue this collaboration, recent notes reviewed.      Relevant Medications   valsartan (DIOVAN) 40 MG tablet     Musculoskeletal and Integument   Onychomycosis    Bilateral -- will trial Penlac -- educated him on this.      Relevant Medications   ciclopirox (PENLAC) 8 % solution     Other   Elevated low density lipoprotein (LDL) cholesterol level    Noted on past labs, recheck today -- he is not completely fasting and start medication as needed.      Obesity    BMI 34.02.  Recommended eating smaller high protein, low fat meals more frequently and exercising 30 mins a day 5 times a week with a goal of 10-15lb weight loss in the next 3 months. Patient voiced their understanding and motivation to adhere to these recommendations.       Tachycardia    Chronic, stable.  Followed by cardiology, continue this collaboration, recent notes reviewed.      Vitamin D deficiency    History of low levels, with supplement taken.   Check today and adjust as needed.      Relevant Orders   VITAMIN D 25 Hydroxy (Vit-D Deficiency, Fractures)   Other Visit Diagnoses     Bilateral impacted cerumen       To bilateral ears noted on exam.  Ears irrigated today with moderate cerumen present.  Tolerated well.   Benign prostatic hyperplasia without lower urinary tract symptoms       PSA on labs.   Relevant Orders   PSA  Need for hepatitis C screening test       Hep C screening on labs today, discussed with patient.   Relevant Orders   Hepatitis C Antibody   Encounter to establish care       New patient to office, introduced to practice setting and provider.       Return in about 4 weeks (around 06/25/2022) for HTN -- added Valsartan.   Marjie Skiff, NP

## 2022-05-28 NOTE — Addendum Note (Signed)
Addended by: Aura Dials T on: 05/28/2022 03:15 PM   Modules accepted: Orders

## 2022-05-28 NOTE — Assessment & Plan Note (Signed)
Chronic, stable.  Followed by cardiology, continue this collaboration, recent notes reviewed.

## 2022-05-28 NOTE — Assessment & Plan Note (Signed)
Bilateral -- will trial Penlac -- educated him on this.

## 2022-05-28 NOTE — Assessment & Plan Note (Signed)
Noted on past labs, recheck today -- he is not completely fasting and start medication as needed.

## 2022-05-29 LAB — CBC WITH DIFFERENTIAL/PLATELET
Basophils Absolute: 0.1 10*3/uL (ref 0.0–0.2)
Basos: 1 %
EOS (ABSOLUTE): 0.1 10*3/uL (ref 0.0–0.4)
Eos: 1 %
Hematocrit: 48 % (ref 37.5–51.0)
Hemoglobin: 16.8 g/dL (ref 13.0–17.7)
Immature Grans (Abs): 0 10*3/uL (ref 0.0–0.1)
Immature Granulocytes: 1 %
Lymphocytes Absolute: 1.3 10*3/uL (ref 0.7–3.1)
Lymphs: 24 %
MCH: 33.9 pg — ABNORMAL HIGH (ref 26.6–33.0)
MCHC: 35 g/dL (ref 31.5–35.7)
MCV: 97 fL (ref 79–97)
Monocytes Absolute: 0.4 10*3/uL (ref 0.1–0.9)
Monocytes: 8 %
Neutrophils Absolute: 3.5 10*3/uL (ref 1.4–7.0)
Neutrophils: 65 %
Platelets: 268 10*3/uL (ref 150–450)
RBC: 4.96 x10E6/uL (ref 4.14–5.80)
RDW: 12.2 % (ref 11.6–15.4)
WBC: 5.4 10*3/uL (ref 3.4–10.8)

## 2022-05-29 LAB — COMPREHENSIVE METABOLIC PANEL
ALT: 19 IU/L (ref 0–44)
AST: 17 IU/L (ref 0–40)
Albumin/Globulin Ratio: 1.9 (ref 1.2–2.2)
Albumin: 4.9 g/dL (ref 3.8–4.9)
Alkaline Phosphatase: 110 IU/L (ref 44–121)
BUN/Creatinine Ratio: 10 (ref 10–24)
BUN: 10 mg/dL (ref 8–27)
Bilirubin Total: 1 mg/dL (ref 0.0–1.2)
CO2: 24 mmol/L (ref 20–29)
Calcium: 9.6 mg/dL (ref 8.6–10.2)
Chloride: 102 mmol/L (ref 96–106)
Creatinine, Ser: 0.96 mg/dL (ref 0.76–1.27)
Globulin, Total: 2.6 g/dL (ref 1.5–4.5)
Glucose: 88 mg/dL (ref 70–99)
Potassium: 4.3 mmol/L (ref 3.5–5.2)
Sodium: 140 mmol/L (ref 134–144)
Total Protein: 7.5 g/dL (ref 6.0–8.5)
eGFR: 90 mL/min/{1.73_m2} (ref >59)

## 2022-05-29 LAB — TSH: TSH: 1.12 u[IU]/mL (ref 0.450–4.500)

## 2022-05-29 LAB — LIPID PANEL W/O CHOL/HDL RATIO
Cholesterol, Total: 200 mg/dL — ABNORMAL HIGH (ref 100–199)
HDL: 61 mg/dL (ref >39)
LDL Chol Calc (NIH): 115 mg/dL — ABNORMAL HIGH (ref 0–99)
Triglycerides: 138 mg/dL (ref 0–149)
VLDL Cholesterol Cal: 24 mg/dL (ref 5–40)

## 2022-05-29 LAB — MICROALBUMIN / CREATININE URINE RATIO
Creatinine, Urine: 65 mg/dL
Microalb/Creat Ratio: <5 mg/g creat (ref 0–29)
Microalbumin, Urine: <3 ug/mL

## 2022-05-29 LAB — PSA: Prostate Specific Ag, Serum: 0.9 ng/mL (ref 0.0–4.0)

## 2022-05-29 LAB — HEPATITIS C ANTIBODY: Hep C Virus Ab: NONREACTIVE

## 2022-05-29 LAB — VITAMIN D 25 HYDROXY (VIT D DEFICIENCY, FRACTURES): Vit D, 25-Hydroxy: 33.8 ng/mL (ref 30.0–100.0)

## 2022-05-29 NOTE — Progress Notes (Signed)
Contacted via MyChart The 10-year ASCVD risk score (Arnett DK, et al., 2019) is: 10.4%   Values used to calculate the score:     Age: 61 years     Sex: Male     Is Non-Hispanic African American: No     Diabetic: No     Tobacco smoker: No     Systolic Blood Pressure: 170 mmHg     Is BP treated: Yes     HDL Cholesterol: 61 mg/dL     Total Cholesterol: 200 mg/dL   Good afternoon Eric Mclaughlin, your labs have returned and overall they are stable.  Kidney function, creatinine and eGFR, remains normal, as is liver function, AST and ALT. Cholesterol labs are elevated, I would like to talk to you at follow-up about starting medication to lower these levels and help prevent heart attack and stroke for you.  We will discuss and I will go over guidelines on this with you.  Any questions? Keep being amazing!!  Thank you for allowing me to participate in your care.  I appreciate you. Kindest regards, Halsey Hammen

## 2022-06-22 ENCOUNTER — Encounter: Payer: Self-pay | Admitting: Nurse Practitioner

## 2022-06-22 ENCOUNTER — Telehealth (INDEPENDENT_AMBULATORY_CARE_PROVIDER_SITE_OTHER): Payer: Managed Care, Other (non HMO) | Admitting: Nurse Practitioner

## 2022-06-22 VITALS — BP 143/81 | HR 95 | Temp 98.0°F

## 2022-06-22 DIAGNOSIS — J019 Acute sinusitis, unspecified: Secondary | ICD-10-CM

## 2022-06-22 DIAGNOSIS — J329 Chronic sinusitis, unspecified: Secondary | ICD-10-CM | POA: Insufficient documentation

## 2022-06-22 DIAGNOSIS — J0110 Acute frontal sinusitis, unspecified: Secondary | ICD-10-CM

## 2022-06-22 MED ORDER — PREDNISONE 20 MG PO TABS: 40.0000 mg | ORAL_TABLET | Freq: Every day | ORAL | 0 refills | Status: DC

## 2022-06-22 MED ORDER — AZITHROMYCIN 250 MG PO TABS: ORAL_TABLET | ORAL | 0 refills | Status: DC

## 2022-06-22 NOTE — Patient Instructions (Signed)

## 2022-06-22 NOTE — Assessment & Plan Note (Signed)
Acute for one week with no improvement.  Wife recently sick, negative for Covid.  Will start Azithromycin and Prednisone.  Recommend: - Increased rest - Increasing Fluids - Acetaminophen / ibuprofen as needed for fever/pain.  - Salt water gargling, chloraseptic spray and throat lozenges - OTC Coricidin  - Mucinex.  - Humidifying the air.

## 2022-06-22 NOTE — Progress Notes (Signed)
BP (!) 143/81   Pulse 95   Temp 98 F (36.7 C) (Temporal)    Subjective:    Patient ID: Eric Mclaughlin, male    DOB: 03-24-1961, 62 y.o.   MRN: [id number]  HPI: Eric Mclaughlin is a 62 y.o. male  Chief Complaint  Patient presents with   Cough    Congestion and sore throat started on Sunday.    This visit was completed via telephone due to the restrictions of the COVID-19 pandemic. All issues as above were discussed and addressed but no physical exam was performed. If it was felt that the patient should be evaluated in the office, they were directed there. The patient verbally consented to this visit. Patient was unable to complete an audio/visual visit due to Technical difficulties", "Lack of internet. Due to the catastrophic nature of the COVID-19 pandemic, this visit was done through audio contact only.  He could not hear provider. Location of the patient: home Location of the provider: work Those involved with this call:  Provider: Aura Dials, DNP CMA: Tristan Schroeder, CMA Front Desk/Registration: [employer]  Time spent on call:  21 minutes on the phone discussing health concerns. 15 minutes total spent in review of patient's record and preparation of their chart.  I verified patient identity using two factors (patient name and date of birth). Patient consents verbally to being seen via telemedicine visit today.    UPPER RESPIRATORY TRACT INFECTION Started with symptoms one week ago -- feeling off and sore throat.  His wife has been sick for a couple weeks and is getting treatment.  Has not had Covid and flu vaccines.  His wife tested negative for Covid.   Fever: no Cough: yes Shortness of breath: no Wheezing: no Chest pain: no Chest tightness: no Chest congestion: no Nasal congestion: no Runny nose: yes Post nasal drip: yes Sneezing: no Sore throat: yes Swollen glands: no Sinus pressure: yes Headache: yes Face pain: no Toothache: no Ear pain: yes left --  this improved Ear pressure: none Eyes red/itching:no Eye drainage/crusting: no  Vomiting: no Rash: no Fatigue: yes Sick contacts: yes Strep contacts: no  Context: fluctuating Recurrent sinusitis: no Relief with OTC cold/cough medications: no  Treatments attempted: Mucinex, Coricidin    Relevant past medical, surgical, family and social history reviewed and updated as indicated. Interim medical history since our last visit reviewed. Allergies and medications reviewed and updated.  Review of Systems  Constitutional:  Positive for fatigue. Negative for activity change, appetite change, chills, diaphoresis and fever.  HENT:  Positive for congestion, postnasal drip, rhinorrhea and sore throat. Negative for ear discharge, ear pain, sinus pressure, sinus pain, sneezing and voice change.   Respiratory:  Positive for cough. Negative for chest tightness, shortness of breath and wheezing.   Cardiovascular:  Negative for chest pain, palpitations and leg swelling.  Gastrointestinal: Negative.   Neurological: Negative.   Psychiatric/Behavioral: Negative.      Per HPI unless specifically indicated above     Objective:    BP (!) 143/81   Pulse 95   Temp 98 F (36.7 C) (Temporal)   Wt Readings from Last 3 Encounters:  05/28/22 243 lb 14.4 oz (110.6 kg)  05/13/19 250 lb (113.4 kg)  07/04/17 250 lb (113.4 kg)    Able to initially see and hear patient virtually, but he could not hear provider.  Physical Exam Vitals and nursing note reviewed.  Constitutional:      General: He is awake. He  is not in acute distress.    Appearance: He is well-developed. He is not ill-appearing.  HENT:     Head: Normocephalic.     Right Ear: Hearing normal. No drainage.     Left Ear: Hearing normal. No drainage.  Eyes:     General: Lids are normal.        Right eye: No discharge.        Left eye: No discharge.     Conjunctiva/sclera: Conjunctivae normal.  Pulmonary:     Effort: Pulmonary effort is  normal. No accessory muscle usage or respiratory distress.  Musculoskeletal:     Cervical back: Normal range of motion.  Neurological:     Mental Status: He is alert and oriented to person, place, and time.  Psychiatric:        Mood and Affect: Mood normal.        Behavior: Behavior normal. Behavior is cooperative.        Thought Content: Thought content normal.        Judgment: Judgment normal.     Results for orders placed or performed in visit on 05/28/22  Hepatitis C Antibody  Result Value Ref Range   Hep C Virus Ab Non Reactive Non Reactive  CBC with Differential/Platelet  Result Value Ref Range   WBC 5.4 3.4 - 10.8 x10E3/uL   RBC 4.96 4.14 - 5.80 x10E6/uL   Hemoglobin 16.8 13.0 - 17.7 g/dL   Hematocrit 48.0 37.5 - 51.0 %   MCV 97 79 - 97 fL   MCH 33.9 (H) 26.6 - 33.0 pg   MCHC 35.0 31.5 - 35.7 g/dL   RDW 12.2 11.6 - 15.4 %   Platelets 268 150 - 450 x10E3/uL   Neutrophils 65 Not Estab. %   Lymphs 24 Not Estab. %   Monocytes 8 Not Estab. %   Eos 1 Not Estab. %   Basos 1 Not Estab. %   Neutrophils Absolute 3.5 1.4 - 7.0 x10E3/uL   Lymphocytes Absolute 1.3 0.7 - 3.1 x10E3/uL   Monocytes Absolute 0.4 0.1 - 0.9 x10E3/uL   EOS (ABSOLUTE) 0.1 0.0 - 0.4 x10E3/uL   Basophils Absolute 0.1 0.0 - 0.2 x10E3/uL   Immature Granulocytes 1 Not Estab. %   Immature Grans (Abs) 0.0 0.0 - 0.1 x10E3/uL  Comprehensive metabolic panel  Result Value Ref Range   Glucose 88 70 - 99 mg/dL   BUN 10 8 - 27 mg/dL   Creatinine, Ser 0.96 0.76 - 1.27 mg/dL   eGFR 90 >59 mL/min/1.73   BUN/Creatinine Ratio 10 10 - 24   Sodium 140 134 - 144 mmol/L   Potassium 4.3 3.5 - 5.2 mmol/L   Chloride 102 96 - 106 mmol/L   CO2 24 20 - 29 mmol/L   Calcium 9.6 8.6 - 10.2 mg/dL   Total Protein 7.5 6.0 - 8.5 g/dL   Albumin 4.9 3.8 - 4.9 g/dL   Globulin, Total 2.6 1.5 - 4.5 g/dL   Albumin/Globulin Ratio 1.9 1.2 - 2.2   Bilirubin Total 1.0 0.0 - 1.2 mg/dL   Alkaline Phosphatase 110 44 - 121 IU/L   AST 17 0  - 40 IU/L   ALT 19 0 - 44 IU/L  Lipid Panel w/o Chol/HDL Ratio  Result Value Ref Range   Cholesterol, Total 200 (H) 100 - 199 mg/dL   Triglycerides 138 0 - 149 mg/dL   HDL 61 >39 mg/dL   VLDL Cholesterol Cal 24 5 - 40 mg/dL   LDL Chol  Calc (NIH) 115 (H) 0 - 99 mg/dL  TSH  Result Value Ref Range   TSH 1.120 0.450 - 4.500 uIU/mL  PSA  Result Value Ref Range   Prostate Specific Ag, Serum 0.9 0.0 - 4.0 ng/mL  VITAMIN D 25 Hydroxy (Vit-D Deficiency, Fractures)  Result Value Ref Range   Vit D, 25-Hydroxy 33.8 30.0 - 100.0 ng/mL  Urine Microalbumin w/creat. ratio  Result Value Ref Range   Creatinine, Urine 65.0 Not Estab. mg/dL   Microalbumin, Urine <3.0 Not Estab. ug/mL   Microalb/Creat Ratio <5 0 - 29 mg/g creat      Assessment & Plan:   Problem List Items Addressed This Visit       Respiratory   Sinusitis - Primary    Acute for one week with no improvement.  Wife recently sick, negative for Covid.  Will start Azithromycin and Prednisone.  Recommend: - Increased rest - Increasing Fluids - Acetaminophen / ibuprofen as needed for fever/pain.  - Salt water gargling, chloraseptic spray and throat lozenges - OTC Coricidin  - Mucinex.  - Humidifying the air.       Relevant Medications   azithromycin (ZITHROMAX) 250 MG tablet   predniSONE (DELTASONE) 20 MG tablet    I discussed the assessment and treatment plan with the patient. The patient was provided an opportunity to ask questions and all were answered. The patient agreed with the plan and demonstrated an understanding of the instructions.   The patient was advised to call back or seek an in-person evaluation if the symptoms worsen or if the condition fails to improve as anticipated.   I provided 21+ minutes of time during this encounter.    Follow up plan: Return in about 3 days (around 06/25/2022) for scheduled.

## 2022-06-23 NOTE — Patient Instructions (Signed)

## 2022-06-25 ENCOUNTER — Encounter: Payer: Self-pay | Admitting: Nurse Practitioner

## 2022-06-25 ENCOUNTER — Ambulatory Visit: Payer: Managed Care, Other (non HMO) | Admitting: Nurse Practitioner

## 2022-06-25 VITALS — BP 130/80 | HR 82 | Temp 97.5°F | Ht 70.98 in | Wt 245.7 lb

## 2022-06-25 DIAGNOSIS — Z6834 Body mass index (BMI) 34.0-34.9, adult: Secondary | ICD-10-CM

## 2022-06-25 DIAGNOSIS — I10 Essential (primary) hypertension: Secondary | ICD-10-CM | POA: Diagnosis not present

## 2022-06-25 DIAGNOSIS — E7800 Pure hypercholesterolemia, unspecified: Secondary | ICD-10-CM | POA: Diagnosis not present

## 2022-06-25 DIAGNOSIS — E6609 Other obesity due to excess calories: Secondary | ICD-10-CM | POA: Diagnosis not present

## 2022-06-25 MED ORDER — VALSARTAN 80 MG PO TABS: 80.0000 mg | ORAL_TABLET | Freq: Every day | ORAL | 4 refills | Status: DC

## 2022-06-25 NOTE — Assessment & Plan Note (Signed)
Chronic, ongoing.  BP trending down, but not consistently at goal at home or in office.  Increase Valsartan to 80 MG daily and adjust further as needed, educated him on this medication and use + side effects to monitor for.  Recommend she monitor BP at least a few mornings a week at home and document.  DASH diet at home.  Labs today: up to date -- recheck BMP and lipid next visit.  Avoid Ibuprofen use and cut back on any alcohol intake.  Return in 4 weeks.

## 2022-06-25 NOTE — Assessment & Plan Note (Signed)
BMI 34.28.  Recommended eating smaller high protein, low fat meals more frequently and exercising 30 mins a day 5 times a week with a goal of 10-15lb weight loss in the next 3 months. Patient voiced their understanding and motivation to adhere to these recommendations.  

## 2022-06-25 NOTE — Progress Notes (Signed)
BP 130/80   Pulse 82   Temp (!) 97.5 F (36.4 C) (Oral)   Ht 5' 10.98" (1.803 m)   Wt 245 lb 11.2 oz (111.4 kg)   SpO2 99%   BMI 34.28 kg/m    Subjective:    Patient ID: Eric Mclaughlin, male    DOB: 1960-08-31, 62 y.o.   MRN: [id number]  HPI: Eric Mclaughlin is a 62 y.o. male  Chief Complaint  Patient presents with   Hypertension    Valsartan was added at last visit   HYPERTENSION  Started on Valsartan 40 MG last visit, is tolerating well.  Not feeling as light headed and faint since starting. Hypertension status: stable  Satisfied with current treatment? yes Duration of hypertension: chronic BP monitoring frequency:  daily BP range: average 139/89 at home = 128/84 to 150/95 BP medication side effects:  no Medication compliance: good compliance Previous BP meds: Valsartan Aspirin: no Recurrent headaches: no Visual changes: no Palpitations: no Dyspnea: no Chest pain: no Lower extremity edema: no Dizzy/lightheaded: no  The 10-year ASCVD risk score (Arnett DK, et al., 2019) is: 9%   Values used to calculate the score:     Age: 51 years     Sex: Male     Is Non-Hispanic African American: No     Diabetic: No     Tobacco smoker: No     Systolic Blood Pressure: 130 mmHg     Is BP treated: Yes     HDL Cholesterol: 61 mg/dL     Total Cholesterol: 200 mg/dL   Relevant past medical, surgical, family and social history reviewed and updated as indicated. Interim medical history since our last visit reviewed. Allergies and medications reviewed and updated.  Review of Systems  Constitutional:  Negative for activity change, diaphoresis, fatigue and fever.  Respiratory:  Negative for cough, chest tightness, shortness of breath and wheezing.   Cardiovascular:  Negative for chest pain, palpitations and leg swelling.  Gastrointestinal: Negative.   Endocrine: Negative for cold intolerance, heat intolerance, polydipsia, polyphagia and polyuria.  Neurological:  Negative  for dizziness, syncope, weakness, light-headedness, numbness and headaches.    Per HPI unless specifically indicated above     Objective:    BP 130/80   Pulse 82   Temp (!) 97.5 F (36.4 C) (Oral)   Ht 5' 10.98" (1.803 m)   Wt 245 lb 11.2 oz (111.4 kg)   SpO2 99%   BMI 34.28 kg/m   Wt Readings from Last 3 Encounters:  06/25/22 245 lb 11.2 oz (111.4 kg)  05/28/22 243 lb 14.4 oz (110.6 kg)  05/13/19 250 lb (113.4 kg)    Physical Exam Vitals and nursing note reviewed.  Constitutional:      General: He is awake. He is not in acute distress.    Appearance: He is well-developed and well-groomed. He is obese. He is not ill-appearing or toxic-appearing.  HENT:     Head: Normocephalic.  Eyes:     General: Lids are normal.     Extraocular Movements: Extraocular movements intact.     Conjunctiva/sclera: Conjunctivae normal.  Neck:     Thyroid: No thyromegaly.     Vascular: No carotid bruit.  Cardiovascular:     Rate and Rhythm: Normal rate and regular rhythm.     Heart sounds: Normal heart sounds.  Pulmonary:     Effort: No accessory muscle usage or respiratory distress.     Breath sounds: Normal breath sounds.  Abdominal:  General: Bowel sounds are normal. There is no distension.     Palpations: Abdomen is soft.     Tenderness: There is no abdominal tenderness.  Musculoskeletal:     Cervical back: Full passive range of motion without pain.     Right lower leg: No edema.     Left lower leg: No edema.  Lymphadenopathy:     Cervical: No cervical adenopathy.  Skin:    General: Skin is warm.     Capillary Refill: Capillary refill takes less than 2 seconds.  Neurological:     Mental Status: He is alert and oriented to person, place, and time.     Deep Tendon Reflexes: Reflexes are normal and symmetric.     Reflex Scores:      Brachioradialis reflexes are 2+ on the right side and 2+ on the left side.      Patellar reflexes are 2+ on the right side and 2+ on the left  side. Psychiatric:        Attention and Perception: Attention normal.        Mood and Affect: Mood normal.        Speech: Speech normal.        Behavior: Behavior normal. Behavior is cooperative.        Thought Content: Thought content normal.     Results for orders placed or performed in visit on 05/28/22  Hepatitis C Antibody  Result Value Ref Range   Hep C Virus Ab Non Reactive Non Reactive  CBC with Differential/Platelet  Result Value Ref Range   WBC 5.4 3.4 - 10.8 x10E3/uL   RBC 4.96 4.14 - 5.80 x10E6/uL   Hemoglobin 16.8 13.0 - 17.7 g/dL   Hematocrit 48.0 37.5 - 51.0 %   MCV 97 79 - 97 fL   MCH 33.9 (H) 26.6 - 33.0 pg   MCHC 35.0 31.5 - 35.7 g/dL   RDW 12.2 11.6 - 15.4 %   Platelets 268 150 - 450 x10E3/uL   Neutrophils 65 Not Estab. %   Lymphs 24 Not Estab. %   Monocytes 8 Not Estab. %   Eos 1 Not Estab. %   Basos 1 Not Estab. %   Neutrophils Absolute 3.5 1.4 - 7.0 x10E3/uL   Lymphocytes Absolute 1.3 0.7 - 3.1 x10E3/uL   Monocytes Absolute 0.4 0.1 - 0.9 x10E3/uL   EOS (ABSOLUTE) 0.1 0.0 - 0.4 x10E3/uL   Basophils Absolute 0.1 0.0 - 0.2 x10E3/uL   Immature Granulocytes 1 Not Estab. %   Immature Grans (Abs) 0.0 0.0 - 0.1 x10E3/uL  Comprehensive metabolic panel  Result Value Ref Range   Glucose 88 70 - 99 mg/dL   BUN 10 8 - 27 mg/dL   Creatinine, Ser 0.96 0.76 - 1.27 mg/dL   eGFR 90 >59 mL/min/1.73   BUN/Creatinine Ratio 10 10 - 24   Sodium 140 134 - 144 mmol/L   Potassium 4.3 3.5 - 5.2 mmol/L   Chloride 102 96 - 106 mmol/L   CO2 24 20 - 29 mmol/L   Calcium 9.6 8.6 - 10.2 mg/dL   Total Protein 7.5 6.0 - 8.5 g/dL   Albumin 4.9 3.8 - 4.9 g/dL   Globulin, Total 2.6 1.5 - 4.5 g/dL   Albumin/Globulin Ratio 1.9 1.2 - 2.2   Bilirubin Total 1.0 0.0 - 1.2 mg/dL   Alkaline Phosphatase 110 44 - 121 IU/L   AST 17 0 - 40 IU/L   ALT 19 0 - 44 IU/L  Lipid Panel w/o  Chol/HDL Ratio  Result Value Ref Range   Cholesterol, Total 200 (H) 100 - 199 mg/dL   Triglycerides 053 0  - 149 mg/dL   HDL 61 >97 mg/dL   VLDL Cholesterol Cal 24 5 - 40 mg/dL   LDL Chol Calc (NIH) 673 (H) 0 - 99 mg/dL  TSH  Result Value Ref Range   TSH 1.120 0.450 - 4.500 uIU/mL  PSA  Result Value Ref Range   Prostate Specific Ag, Serum 0.9 0.0 - 4.0 ng/mL  VITAMIN D 25 Hydroxy (Vit-D Deficiency, Fractures)  Result Value Ref Range   Vit D, 25-Hydroxy 33.8 30.0 - 100.0 ng/mL  Urine Microalbumin w/creat. ratio  Result Value Ref Range   Creatinine, Urine 65.0 Not Estab. mg/dL   Microalbumin, Urine <4.1 Not Estab. ug/mL   Microalb/Creat Ratio <5 0 - 29 mg/g creat      Assessment & Plan:   Problem List Items Addressed This Visit       Cardiovascular and Mediastinum   Essential hypertension - Primary    Chronic, ongoing.  BP trending down, but not consistently at goal at home or in office.  Increase Valsartan to 80 MG daily and adjust further as needed, educated him on this medication and use + side effects to monitor for.  Recommend she monitor BP at least a few mornings a week at home and document.  DASH diet at home.  Labs today: up to date -- recheck BMP and lipid next visit.  Avoid Ibuprofen use and cut back on any alcohol intake.  Return in 4 weeks.       Relevant Medications   valsartan (DIOVAN) 80 MG tablet     Other   Elevated low density lipoprotein (LDL) cholesterol level    Ongoing, plan on rechecking labs next visit fasting as recent was not fasting.  Current ASCVD 9%, may benefit medication if ongoing elevation present.      Obesity    BMI 34.28.  Recommended eating smaller high protein, low fat meals more frequently and exercising 30 mins a day 5 times a week with a goal of 10-15lb weight loss in the next 3 months. Patient voiced their understanding and motivation to adhere to these recommendations.         Follow up plan: Return in about 4 weeks (around 07/23/2022) for HTN -- increase Valsartan to 80 MG.

## 2022-06-25 NOTE — Assessment & Plan Note (Signed)
Ongoing, plan on rechecking labs next visit fasting as recent was not fasting.  Current ASCVD 9%, may benefit medication if ongoing elevation present.

## 2022-07-05 ENCOUNTER — Encounter: Payer: Self-pay | Admitting: Physician Assistant

## 2022-07-05 ENCOUNTER — Ambulatory Visit: Payer: Self-pay | Admitting: *Deleted

## 2022-07-05 ENCOUNTER — Ambulatory Visit: Payer: Managed Care, Other (non HMO) | Admitting: Physician Assistant

## 2022-07-05 VITALS — BP 112/83 | HR 88 | Temp 99.0°F | Ht 70.98 in | Wt 246.5 lb

## 2022-07-05 DIAGNOSIS — J069 Acute upper respiratory infection, unspecified: Secondary | ICD-10-CM | POA: Diagnosis not present

## 2022-07-05 MED ORDER — BENZONATATE 200 MG PO CAPS: 200.0000 mg | ORAL_CAPSULE | Freq: Two times a day (BID) | ORAL | 0 refills | Status: DC | PRN

## 2022-07-05 NOTE — Telephone Encounter (Signed)
Summary: Covid Postive Advice   Pt is calling to report that he tested positive for COVID today. Pt saw Erin Mecum in [place] today. Wanting to know what he can take? Please advise           Chief Complaint: covid positive test today requesting medication Symptoms: see OV 07/05/22 Frequency: sx started 07/02/22 Pertinent Negatives: Patient denies chest pain no difficulty breathing  Disposition: [] ED /[] Urgent Care (no appt availability in office) / [] Appointment(In office/virtual)/ []  [facility]/ [] Home Care/ [] Refused Recommended Disposition /[] [facility]/ [x]  Follow-up with PCP Additional Notes:   Last OV today. Called back to report covid at home test positive. Please advise medication requested.      Reason for Disposition  [1] HIGH RISK patient (e.g., weak immune system, age > 23 years, obesity with BMI 30 or higher, pregnant, chronic lung disease or other chronic medical condition) AND [2] COVID symptoms (e.g., cough, fever)  (Exceptions: Already seen by PCP and no new or worsening symptoms.)  Answer Assessment - Initial Assessment Questions 1. COVID-19 DIAGNOSIS: "How do you know that you have COVID?" (e.g., positive lab test or self-test, diagnosed by doctor or NP/PA, symptoms after exposure).     Covid positive at home test today  2. COVID-19 EXPOSURE: "Was there any known exposure to COVID before the symptoms began?" CDC Definition of close contact: within 6 feet (2 meters) for a total of 15 minutes or more over a 24-hour period.      na 3. ONSET: "When did the COVID-19 symptoms start?"      Monday 07/02/22 4. WORST SYMPTOM: "What is your worst symptom?" (e.g., cough, fever, shortness of breath, muscle aches)     See OV 07/05/22 5. COUGH: "Do you have a cough?" If Yes, ask: "How bad is the cough?"       na 6. FEVER: "Do you have a fever?" If Yes, ask: "What is your temperature, how was it measured, and when did it start?"     na 7. RESPIRATORY STATUS:  "Describe your breathing?" (e.g., normal; shortness of breath, wheezing, unable to speak)      na 8. BETTER-SAME-WORSE: "Are you getting better, staying the same or getting worse compared to yesterday?"  If getting worse, ask, "In what way?"     na 9. OTHER SYMPTOMS: "Do you have any other symptoms?"  (e.g., chills, fatigue, headache, loss of smell or taste, muscle pain, sore throat)     na 10. HIGH RISK DISEASE: "Do you have any chronic medical problems?" (e.g., asthma, heart or lung disease, weak immune system, obesity, etc.)       Hx HTN 11. VACCINE: "Have you had the COVID-19 vaccine?" If Yes, ask: "Which one, how many shots, when did you get it?"       na 12. PREGNANCY: "Is there any chance you are pregnant?" "When was your last menstrual period?"       na 13. O2 SATURATION MONITOR:  "Do you use an oxygen saturation monitor (pulse oximeter) at home?" If Yes, ask "What is your reading (oxygen level) today?" "What is your usual oxygen saturation reading?" (e.g., 95%)       na  Protocols used: Coronavirus (COVID-19) Diagnosed or Suspected-A-AH

## 2022-07-05 NOTE — Progress Notes (Signed)
Acute Office Visit   Patient: Eric Mclaughlin   DOB: May 23, 1961   62 y.o. Male  MRN: [id number] Visit Date: 07/05/2022  Today's healthcare provider: Dani Gobble Tyrianna Lightle, PA-C  Introduced myself to the patient as a Journalist, newspaper and provided education on APPs in clinical practice.    Chief Complaint  Patient presents with   Chills    Started on Monday, having chills, shakes, cough, sore throat, had a fever on Tuesday and yesterday morning.   Subjective    HPI HPI     Chills    Additional comments: Started on Monday, having chills, shakes, cough, sore throat, had a fever on Tuesday and yesterday morning.      Last edited by Jerelene Redden, CMA on 07/05/2022 10:32 AM.       [place] Sours Symptoms  Reports he is feeling better than he was 2 days ago  Onset: gradual  Duration: Started Monday  Associated Symptoms: fever, headache, sore throat, coughing fits,  He reports some productive coughing yesterday but this was main day for that   Interventions: Finished Z pack and prednisone last week, Coricidin   Sick contacts: his wife has similar symptoms  COVID testing at home: tested about 2-3 weeks ago  Results: Negative    Was seen virtually on 06/25/22 for sinusitis  Provided Zpack and Prednisone  Felt good 06/25/22 Thinks this resolved but now has new symptoms    Medications: Outpatient Medications Prior to Visit  Medication Sig   ciclopirox (PENLAC) 8 % solution Apply topically at bedtime. Apply over nail and surrounding skin. Apply daily over previous coat. After seven (7) days, may remove with alcohol and continue cycle.   valsartan (DIOVAN) 80 MG tablet Take 1 tablet (80 mg total) by mouth daily.   No facility-administered medications prior to visit.    Review of Systems  Constitutional:  Positive for fatigue and fever (101.8 on Tuesday). Negative for chills.  HENT:  Positive for congestion, postnasal drip and sore throat. Negative for ear pain, sinus pressure and  sinus pain.   Respiratory:  Positive for cough. Negative for shortness of breath and wheezing.   Gastrointestinal:  Negative for diarrhea, nausea and vomiting.  Musculoskeletal:  Positive for myalgias (a few days ago).  Neurological:  Negative for dizziness, light-headedness and headaches.       Objective    BP 112/83   Pulse 88   Temp 99 F (37.2 C) (Oral)   Ht 5' 10.98" (1.803 m)   Wt 246 lb 8 oz (111.8 kg)   SpO2 98%   BMI 34.40 kg/m    Physical Exam Vitals reviewed.  Constitutional:      General: He is awake.     Appearance: Normal appearance. He is well-developed and well-groomed. He is not ill-appearing or toxic-appearing.  HENT:     Head: Normocephalic and atraumatic.     Right Ear: Hearing and ear canal normal. No hemotympanum. Tympanic membrane is erythematous. Tympanic membrane is not injected, scarred, perforated, retracted or bulging.     Left Ear: Hearing, tympanic membrane and ear canal normal. No hemotympanum. Tympanic membrane is not injected, scarred, perforated, erythematous, retracted or bulging.     Mouth/Throat:     Lips: Pink.     Mouth: Mucous membranes are moist.     Pharynx: Oropharynx is clear. Uvula midline. Posterior oropharyngeal erythema present. No oropharyngeal exudate or uvula swelling.  Cardiovascular:     Rate and Rhythm:  Normal rate and regular rhythm.     Pulses: Normal pulses.          Radial pulses are 2+ on the right side and 2+ on the left side.     Heart sounds: Normal heart sounds. No murmur heard.    No friction rub. No gallop.  Pulmonary:     Effort: Pulmonary effort is normal.     Breath sounds: Normal breath sounds. No decreased air movement. No decreased breath sounds, wheezing, rhonchi or rales.  Musculoskeletal:     Cervical back: Normal range of motion and neck supple. Normal range of motion.  Lymphadenopathy:     Head:     Right side of head: No submental, submandibular or preauricular adenopathy.     Left side of  head: No submental, submandibular or preauricular adenopathy.     Cervical:     Right cervical: No superficial or posterior cervical adenopathy.    Left cervical: No superficial or posterior cervical adenopathy.     Upper Body:     Right upper body: No supraclavicular adenopathy.     Left upper body: No supraclavicular adenopathy.  Neurological:     Mental Status: He is alert.  Psychiatric:        Behavior: Behavior is cooperative.       No results found for any visits on 07/05/22.  Assessment & Plan      No follow-ups on file.       Problem List Items Addressed This Visit   None Visit Diagnoses     Viral upper respiratory tract infection    -  Primary Acute, ongoing concern Visit with patient indicates symptoms comprised of sore throat, congestion, coughing since Monday congruent with acute URI that is likely viral in nature  Patient is outside therapeutic window for antivirals- no flu or COVID testing performed today.  Due to nature and duration of symptoms recommended treatment regimen is symptomatic relief and follow up if needed Will provide Tessalon pearls to assist with coughing Discussed with patient the various viral and bacterial etiologies of current illness and appropriate course of treatment Discussed OTC medication options for multisymptom relief such as Dayquil/Nyquil, Theraflu, AlkaSeltzer, etc. Discussed return precautions if symptoms are not improving or worsen over next 5-7 days.     Relevant Medications   benzonatate (TESSALON) 200 MG capsule        No follow-ups on file.   I, Deforrest Bogle E Orman Matsumura, PA-C, have reviewed all documentation for this visit. The documentation on 07/05/22 for the exam, diagnosis, procedures, and orders are all accurate and complete.   Talitha Givens, MHS, PA-C [facility]

## 2022-07-05 NOTE — Patient Instructions (Addendum)
Based on your described symptoms and the duration of symptoms it is likely that you have a viral upper respiratory infection (often called a "cold")  Symptoms can last for 3-10 days with lingering cough and intermittent symptoms lasting weeks after that.  The goal of treatment at this time is to reduce your symptoms and discomfort   I have sent in Tessalon pearls for you to take twice per day to help with your cough  You can use over the counter medications such as Dayquil/Nyquil, AlkaSeltzer formulations, etc to provide further relief of symptoms according to the manufacturer's instructions   If you have High blood pressure you can take regular Robitussin, regular Mucinex and tylenol to help with your symptoms Avoid anything with decongestants in them as this can impact your blood pressure You can also use nasal saline spray to help flush out your sinuses  You can use a humidifier to help with the dryness at night   If your symptoms do not improve or become worse in the next 5-7 days please make an apt at the office so we can see you  Go to the ER if you begin to have more serious symptoms such as shortness of breath, trouble breathing, loss of consciousness, swelling around the eyes, high fever, severe lasting headaches, vision changes or neck pain/stiffness.

## 2022-07-06 ENCOUNTER — Other Ambulatory Visit: Payer: Self-pay | Admitting: Physician Assistant

## 2022-07-06 DIAGNOSIS — U071 COVID-19: Secondary | ICD-10-CM

## 2022-07-06 MED ORDER — NIRMATRELVIR/RITONAVIR (PAXLOVID)TABLET: 3.0000 | ORAL_TABLET | Freq: Two times a day (BID) | ORAL | 0 refills | Status: AC

## 2022-07-06 NOTE — Telephone Encounter (Signed)
He is on the cusp of COVID antiviral window. I have sent them in but I am not sure how the insurance coverage is at this time. He can also continue symptomatic management with OTC products even if he does take the Paxlovid.

## 2022-07-06 NOTE — Progress Notes (Signed)
Patient tested positive with home test after in-office visit and is in therapeutic window for antivirals Paxlovid was sent in after running medication interaction with current med list- no concerning interactions requiring adjustments per UTD

## 2022-07-06 NOTE — Telephone Encounter (Signed)
Patient notified of providers message. 

## 2022-07-21 NOTE — Patient Instructions (Signed)

## 2022-07-23 ENCOUNTER — Encounter: Payer: Self-pay | Admitting: Nurse Practitioner

## 2022-07-23 ENCOUNTER — Telehealth (INDEPENDENT_AMBULATORY_CARE_PROVIDER_SITE_OTHER): Payer: Managed Care, Other (non HMO) | Admitting: Nurse Practitioner

## 2022-07-23 VITALS — BP 125/79 | HR 65

## 2022-07-23 DIAGNOSIS — I10 Essential (primary) hypertension: Secondary | ICD-10-CM | POA: Diagnosis not present

## 2022-07-23 DIAGNOSIS — E7800 Pure hypercholesterolemia, unspecified: Secondary | ICD-10-CM | POA: Diagnosis not present

## 2022-07-23 MED ORDER — VALSARTAN 80 MG PO TABS: 80.0000 mg | ORAL_TABLET | Freq: Every day | ORAL | 4 refills | Status: DC

## 2022-07-23 NOTE — Progress Notes (Signed)
BP 125/79   Pulse 65    Subjective:    Patient ID: Eric Mclaughlin, male    DOB: Nov 19, 1960, 62 y.o.   MRN: [id number]  HPI: Eric Mclaughlin is a 62 y.o. male  Chief Complaint  Patient presents with   Hypertension   This visit was completed via video visit through MyChart due to the restrictions of the COVID-19 pandemic. All issues as above were discussed and addressed. Physical exam was done as above through visual confirmation on video through MyChart. If it was felt that the patient should be evaluated in the office, they were directed there. The patient verbally consented to this visit. Location of the patient: home Location of the provider: home Those involved with this call:  Provider: Marnee Guarneri, DNP CMA: Yvonna Alanis, [place] Desk/Registration: [employer]  Time spent on call:  21 minutes with patient face to face via video conference. More than 50% of this time was spent in counseling and coordination of care. 15 minutes total spent in review of patient's record and preparation of their chart.  I verified patient identity using two factors (patient name and date of birth). Patient consents verbally to being seen via telemedicine visit today.    HYPERTENSION  Increased Valsartan to 80 MG daily on 06/25/22, tolerating well.  Recent sick visit BP 112/83 and at home readings improving with this dosage.  Overall he reports feeling better, no further headaches.  Is to have fasting labs to recheck cholesterol as last check he had ate a sandwich and elevations in LDL present. Hypertension status: stable  Satisfied with current treatment? yes Duration of hypertension: chronic BP monitoring frequency:  daily BP range: 120/70 average at home past weeks BP medication side effects:  no Medication compliance: good compliance Previous BP meds: Valsartan Aspirin: no Recurrent headaches: no Visual changes: no Palpitations: no Dyspnea: no Chest pain: no Lower extremity  edema: no Dizzy/lightheaded: no   Relevant past medical, surgical, family and social history reviewed and updated as indicated. Interim medical history since our last visit reviewed. Allergies and medications reviewed and updated.  Review of Systems  Constitutional:  Negative for activity change, diaphoresis, fatigue and fever.  Respiratory:  Negative for cough, chest tightness, shortness of breath and wheezing.   Cardiovascular:  Negative for chest pain, palpitations and leg swelling.  Gastrointestinal: Negative.   Endocrine: Negative for cold intolerance, heat intolerance, polydipsia, polyphagia and polyuria.  Neurological:  Negative for dizziness, syncope, weakness, light-headedness, numbness and headaches.    Per HPI unless specifically indicated above     Objective:    BP 125/79   Pulse 65   Wt Readings from Last 3 Encounters:  07/05/22 246 lb 8 oz (111.8 kg)  06/25/22 245 lb 11.2 oz (111.4 kg)  05/28/22 243 lb 14.4 oz (110.6 kg)    Physical Exam Vitals and nursing note reviewed.  Constitutional:      General: He is awake. He is not in acute distress.    Appearance: He is well-developed. He is not ill-appearing.  HENT:     Head: Normocephalic.     Right Ear: Hearing normal. No drainage.     Left Ear: Hearing normal. No drainage.  Eyes:     General: Lids are normal.        Right eye: No discharge.        Left eye: No discharge.     Conjunctiva/sclera: Conjunctivae normal.  Pulmonary:     Effort: Pulmonary effort is  normal. No accessory muscle usage or respiratory distress.  Musculoskeletal:     Cervical back: Normal range of motion.  Neurological:     Mental Status: He is alert and oriented to person, place, and time.  Psychiatric:        Mood and Affect: Mood normal.        Behavior: Behavior normal. Behavior is cooperative.        Thought Content: Thought content normal.        Judgment: Judgment normal.     Results for orders placed or performed in visit  on 05/28/22  Hepatitis C Antibody  Result Value Ref Range   Hep C Virus Ab Non Reactive Non Reactive  CBC with Differential/Platelet  Result Value Ref Range   WBC 5.4 3.4 - 10.8 x10E3/uL   RBC 4.96 4.14 - 5.80 x10E6/uL   Hemoglobin 16.8 13.0 - 17.7 g/dL   Hematocrit 48.0 37.5 - 51.0 %   MCV 97 79 - 97 fL   MCH 33.9 (H) 26.6 - 33.0 pg   MCHC 35.0 31.5 - 35.7 g/dL   RDW 12.2 11.6 - 15.4 %   Platelets 268 150 - 450 x10E3/uL   Neutrophils 65 Not Estab. %   Lymphs 24 Not Estab. %   Monocytes 8 Not Estab. %   Eos 1 Not Estab. %   Basos 1 Not Estab. %   Neutrophils Absolute 3.5 1.4 - 7.0 x10E3/uL   Lymphocytes Absolute 1.3 0.7 - 3.1 x10E3/uL   Monocytes Absolute 0.4 0.1 - 0.9 x10E3/uL   EOS (ABSOLUTE) 0.1 0.0 - 0.4 x10E3/uL   Basophils Absolute 0.1 0.0 - 0.2 x10E3/uL   Immature Granulocytes 1 Not Estab. %   Immature Grans (Abs) 0.0 0.0 - 0.1 x10E3/uL  Comprehensive metabolic panel  Result Value Ref Range   Glucose 88 70 - 99 mg/dL   BUN 10 8 - 27 mg/dL   Creatinine, Ser 0.96 0.76 - 1.27 mg/dL   eGFR 90 >59 mL/min/1.73   BUN/Creatinine Ratio 10 10 - 24   Sodium 140 134 - 144 mmol/L   Potassium 4.3 3.5 - 5.2 mmol/L   Chloride 102 96 - 106 mmol/L   CO2 24 20 - 29 mmol/L   Calcium 9.6 8.6 - 10.2 mg/dL   Total Protein 7.5 6.0 - 8.5 g/dL   Albumin 4.9 3.8 - 4.9 g/dL   Globulin, Total 2.6 1.5 - 4.5 g/dL   Albumin/Globulin Ratio 1.9 1.2 - 2.2   Bilirubin Total 1.0 0.0 - 1.2 mg/dL   Alkaline Phosphatase 110 44 - 121 IU/L   AST 17 0 - 40 IU/L   ALT 19 0 - 44 IU/L  Lipid Panel w/o Chol/HDL Ratio  Result Value Ref Range   Cholesterol, Total 200 (H) 100 - 199 mg/dL   Triglycerides 138 0 - 149 mg/dL   HDL 61 >39 mg/dL   VLDL Cholesterol Cal 24 5 - 40 mg/dL   LDL Chol Calc (NIH) 115 (H) 0 - 99 mg/dL  TSH  Result Value Ref Range   TSH 1.120 0.450 - 4.500 uIU/mL  PSA  Result Value Ref Range   Prostate Specific Ag, Serum 0.9 0.0 - 4.0 ng/mL  VITAMIN D 25 Hydroxy (Vit-D Deficiency,  Fractures)  Result Value Ref Range   Vit D, 25-Hydroxy 33.8 30.0 - 100.0 ng/mL  Urine Microalbumin w/creat. ratio  Result Value Ref Range   Creatinine, Urine 65.0 Not Estab. mg/dL   Microalbumin, Urine <3.0 Not Estab. ug/mL   Microalb/Creat  Ratio <5 0 - 29 mg/g creat      Assessment & Plan:   Problem List Items Addressed This Visit       Cardiovascular and Mediastinum   Essential hypertension - Primary    Chronic, stable with BP now at goal with increase in Valsartan.  Continue Valsartan 80 MG daily and adjust further as needed, educated him on this medication and use + side effects to monitor for.  Recommend she monitor BP at least a few mornings a week at home and document.  DASH diet at home.  Labs today: up to date -- recheck BMP and lipid outpatient.  Avoid Ibuprofen use and cut back on any alcohol intake.  Return in 6 months.       Relevant Medications   valsartan (DIOVAN) 80 MG tablet   Other Relevant Orders   Basic metabolic panel     Other   Elevated low density lipoprotein (LDL) cholesterol level    Plan to recheck this outpatient with him fasting and determine need for medication initiation.      Relevant Orders   Lipid Panel w/o Chol/HDL Ratio    I discussed the assessment and treatment plan with the patient. The patient was provided an opportunity to ask questions and all were answered. The patient agreed with the plan and demonstrated an understanding of the instructions.   The patient was advised to call back or seek an in-person evaluation if the symptoms worsen or if the condition fails to improve as anticipated.   I provided 21+ minutes of time during this encounter.    Follow up plan: Return in about 6 months (around 01/21/2023) for HTN/HLD.

## 2022-07-23 NOTE — Assessment & Plan Note (Signed)
Plan to recheck this outpatient with him fasting and determine need for medication initiation.

## 2022-07-23 NOTE — Assessment & Plan Note (Signed)
Chronic, stable with BP now at goal with increase in Valsartan.  Continue Valsartan 80 MG daily and adjust further as needed, educated him on this medication and use + side effects to monitor for.  Recommend she monitor BP at least a few mornings a week at home and document.  DASH diet at home.  Labs today: up to date -- recheck BMP and lipid outpatient.  Avoid Ibuprofen use and cut back on any alcohol intake.  Return in 6 months.

## 2022-08-20 ENCOUNTER — Other Ambulatory Visit: Payer: Managed Care, Other (non HMO)

## 2022-08-20 DIAGNOSIS — I10 Essential (primary) hypertension: Secondary | ICD-10-CM

## 2022-08-20 DIAGNOSIS — E7800 Pure hypercholesterolemia, unspecified: Secondary | ICD-10-CM

## 2022-08-21 LAB — BASIC METABOLIC PANEL
BUN/Creatinine Ratio: 12 (ref 10–24)
BUN: 11 mg/dL (ref 8–27)
CO2: 22 mmol/L (ref 20–29)
Calcium: 9.4 mg/dL (ref 8.6–10.2)
Chloride: 105 mmol/L (ref 96–106)
Creatinine, Ser: 0.95 mg/dL (ref 0.76–1.27)
Glucose: 99 mg/dL (ref 70–99)
Potassium: 4.4 mmol/L (ref 3.5–5.2)
Sodium: 142 mmol/L (ref 134–144)
eGFR: 92 mL/min/{1.73_m2} (ref >59)

## 2022-08-21 LAB — LIPID PANEL W/O CHOL/HDL RATIO
Cholesterol, Total: 179 mg/dL (ref 100–199)
HDL: 54 mg/dL (ref >39)
LDL Chol Calc (NIH): 103 mg/dL — ABNORMAL HIGH (ref 0–99)
Triglycerides: 124 mg/dL (ref 0–149)
VLDL Cholesterol Cal: 22 mg/dL (ref 5–40)

## 2022-08-21 NOTE — Progress Notes (Signed)
Contacted via [place] afternoon Eric Mclaughlin, your labs have returned: - Kidney function, creatinine and eGFR, remains normal, as are electrolytes. - Cholesterol labs improved with you fasting -- LDL 103.  However, I continue to recommend a low dose of cholesterol medication based on risk score I will place copy of below (we discussed at recent visit).  This is >7%.  A low dose statin would offer some preventative benefit.  Thoughts? The 10-year ASCVD risk score (Arnett DK, et al., 2019) is: 8.3%   Values used to calculate the score:     Age: 63 years     Sex: Male     Is Non-Hispanic African American: No     Diabetic: No     Tobacco smoker: No     Systolic Blood Pressure: 0000000 mmHg     Is BP treated: Yes     HDL Cholesterol: 54 mg/dL     Total Cholesterol: 179 mg/dL Keep being amazing!!  Thank you for allowing me to participate in your care.  I appreciate you. Kindest regards, Macaulay Reicher

## 2023-01-15 NOTE — Patient Instructions (Signed)
Be Involved in Caring For Your Health:  Taking Medications When medications are taken as directed, they can greatly improve your health. But if they are not taken as prescribed, they may not work. In some cases, not taking them correctly can be harmful. To help ensure your treatment remains effective and safe, understand your medications and how to take them. Bring your medications to each visit for review by your provider.  Your lab results, notes, and after visit summary will be available on My Chart. We strongly encourage you to use this feature. If lab results are abnormal the clinic will contact you with the appropriate steps. If the clinic does not contact you assume the results are satisfactory. You can always view your results on My Chart. If you have questions regarding your health or results, please contact the clinic during office hours. You can also ask questions on My Chart.  We at [facility] are grateful that you chose us to provide your care. We strive to provide evidence-based and compassionate care and are always looking for feedback. If you get a survey from the clinic please complete this so we can hear your opinions.  DASH Eating Plan DASH stands for Dietary Approaches to Stop Hypertension. The DASH eating plan is a healthy eating plan that has been shown to: Lower high blood pressure (hypertension). Reduce your risk for type 2 diabetes, heart disease, and stroke. Help with weight loss. What are tips for following this plan? Reading food labels Check food labels for the amount of salt (sodium) per serving. Choose foods with less than 5 percent of the Daily Value (DV) of sodium. In general, foods with less than 300 milligrams (mg) of sodium per serving fit into this eating plan. To find whole grains, look for the word "whole" as the first word in the ingredient list. Shopping Buy products labeled as "low-sodium" or "no salt added." Buy fresh foods. Avoid canned  foods and pre-made or frozen meals. Cooking Try not to add salt when you cook. Use salt-free seasonings or herbs instead of table salt or sea salt. Check with your health care provider or pharmacist before using salt substitutes. Do not fry foods. Cook foods in healthy ways, such as baking, boiling, grilling, roasting, or broiling. Cook using oils that are good for your heart. These include olive, canola, avocado, soybean, and sunflower oil. Meal planning  Eat a balanced diet. This should include: 4 or more servings of fruits and 4 or more servings of vegetables each day. Try to fill half of your plate with fruits and vegetables. 6-8 servings of whole grains each day. 6 or less servings of lean meat, poultry, or fish each day. 1 oz is 1 serving. A 3 oz (85 g) serving of meat is about the same size as the palm of your hand. One egg is 1 oz (28 g). 2-3 servings of low-fat dairy each day. One serving is 1 cup (237 mL). 1 serving of nuts, seeds, or beans 5 times each week. 2-3 servings of heart-healthy fats. Healthy fats called omega-3 fatty acids are found in foods such as walnuts, flaxseeds, fortified milks, and eggs. These fats are also found in cold-water fish, such as sardines, salmon, and mackerel. Limit how much you eat of: Canned or prepackaged foods. Food that is high in trans fat, such as fried foods. Food that is high in saturated fat, such as fatty meat. Desserts and other sweets, sugary drinks, and other foods with added sugar. Full-fat   dairy products. Do not salt foods before eating. Do not eat more than 4 egg yolks a week. Try to eat at least 2 vegetarian meals a week. Eat more home-cooked food and less restaurant, buffet, and fast food. Lifestyle When eating at a restaurant, ask if your food can be made with less salt or no salt. If you drink alcohol: Limit how much you have to: 0-1 drink a day if you are male. 0-2 drinks a day if you are male. Know how much alcohol is in  your drink. In the [place] one drink is one 12 oz bottle of beer (355 mL), one 5 oz glass of wine (148 mL), or one 1 oz glass of hard liquor (44 mL). General information Avoid eating more than 2,300 mg of salt a day. If you have hypertension, you may need to reduce your sodium intake to 1,500 mg a day. Work with your provider to stay at a healthy body weight or lose weight. Ask what the best weight range is for you. On most days of the week, get at least 30 minutes of exercise that causes your heart to beat faster. This may include walking, swimming, or biking. Work with your provider or dietitian to adjust your eating plan to meet your specific calorie needs. What foods should I eat? Fruits All fresh, dried, or frozen fruit. Canned fruits that are in their natural juice and do not have sugar added to them. Vegetables Fresh or frozen vegetables that are raw, steamed, roasted, or grilled. Low-sodium or reduced-sodium tomato and vegetable juice. Low-sodium or reduced-sodium tomato sauce and tomato paste. Low-sodium or reduced-sodium canned vegetables. Grains Whole-grain or whole-wheat bread. Whole-grain or whole-wheat pasta. Brown rice. Oatmeal. Quinoa. Bulgur. Whole-grain and low-sodium cereals. Pita bread. Low-fat, low-sodium crackers. Whole-wheat flour tortillas. Meats and other proteins Skinless chicken or turkey. Ground chicken or turkey. Pork with fat trimmed off. Fish and seafood. Egg whites. Dried beans, peas, or lentils. Unsalted nuts, nut butters, and seeds. Unsalted canned beans. Lean cuts of beef with fat trimmed off. Low-sodium, lean precooked or cured meat, such as sausages or meat loaves. Dairy Low-fat (1%) or fat-free (skim) milk. Reduced-fat, low-fat, or fat-free cheeses. Nonfat, low-sodium ricotta or cottage cheese. Low-fat or nonfat yogurt. Low-fat, low-sodium cheese. Fats and oils Soft margarine without trans fats. Vegetable oil. Reduced-fat, low-fat, or light mayonnaise and salad  dressings (reduced-sodium). Canola, safflower, olive, avocado, soybean, and sunflower oils. Avocado. Seasonings and condiments Herbs. Spices. Seasoning mixes without salt. Other foods Unsalted popcorn and pretzels. Fat-free sweets. The items listed above may not be all the foods and drinks you can have. Talk to a dietitian to learn more. What foods should I avoid? Fruits Canned fruit in a light or heavy syrup. Fried fruit. Fruit in cream or butter sauce. Vegetables Creamed or fried vegetables. Vegetables in a cheese sauce. Regular canned vegetables that are not marked as low-sodium or reduced-sodium. Regular canned tomato sauce and paste that are not marked as low-sodium or reduced-sodium. Regular tomato and vegetable juices that are not marked as low-sodium or reduced-sodium. Pickles. Olives. Grains Baked goods made with fat, such as croissants, muffins, or some breads. Dry pasta or rice meal packs. Meats and other proteins Fatty cuts of meat. Ribs. Fried meat. Bacon. Bologna, salami, and other precooked or cured meats, such as sausages or meat loaves, that are not lean and low in sodium. Fat from the back of a pig (fatback). Bratwurst. Salted nuts and seeds. Canned beans with added salt. Canned   or smoked fish. Whole eggs or egg yolks. Chicken or turkey with skin. Dairy Whole or 2% milk, cream, and half-and-half. Whole or full-fat cream cheese. Whole-fat or sweetened yogurt. Full-fat cheese. Nondairy creamers. Whipped toppings. Processed cheese and cheese spreads. Fats and oils Butter. Stick margarine. Lard. Shortening. Ghee. Bacon fat. Tropical oils, such as coconut, palm kernel, or palm oil. Seasonings and condiments Onion salt, garlic salt, seasoned salt, table salt, and sea salt. Worcestershire sauce. Tartar sauce. Barbecue sauce. Teriyaki sauce. Soy sauce, including reduced-sodium soy sauce. Steak sauce. Canned and packaged gravies. Fish sauce. Oyster sauce. Cocktail sauce. Store-bought  horseradish. Ketchup. Mustard. Meat flavorings and tenderizers. Bouillon cubes. Hot sauces. Pre-made or packaged marinades. Pre-made or packaged taco seasonings. Relishes. Regular salad dressings. Other foods Salted popcorn and pretzels. The items listed above may not be all the foods and drinks you should avoid. Talk to a dietitian to learn more. Where to find more information National Heart, Lung, and Blood Institute (NHLBI): nhlbi.nih.gov American Heart Association (AHA): heart.org Academy of Nutrition and Dietetics: eatright.org National Kidney Foundation (NKF): kidney.org This information is not intended to replace advice given to you by your health care provider. Make sure you discuss any questions you have with your health care provider. Document Revised: 06/21/2022 Document Reviewed: 06/21/2022 Elsevier Patient Education  2024 Elsevier Inc.  

## 2023-01-22 ENCOUNTER — Ambulatory Visit: Payer: Managed Care, Other (non HMO) | Admitting: Nurse Practitioner

## 2023-01-22 ENCOUNTER — Encounter: Payer: Self-pay | Admitting: Nurse Practitioner

## 2023-01-22 ENCOUNTER — Telehealth: Payer: Self-pay

## 2023-01-22 ENCOUNTER — Other Ambulatory Visit: Payer: Self-pay

## 2023-01-22 VITALS — BP 130/82 | HR 73 | Temp 97.8°F | Ht 70.9 in | Wt 247.6 lb

## 2023-01-22 DIAGNOSIS — E7800 Pure hypercholesterolemia, unspecified: Secondary | ICD-10-CM | POA: Diagnosis not present

## 2023-01-22 DIAGNOSIS — Z1211 Encounter for screening for malignant neoplasm of colon: Secondary | ICD-10-CM

## 2023-01-22 DIAGNOSIS — I10 Essential (primary) hypertension: Secondary | ICD-10-CM | POA: Diagnosis not present

## 2023-01-22 MED ORDER — NA SULFATE-K SULFATE-MG SULF 17.5-3.13-1.6 GM/177ML PO SOLN: 1.0000 | Freq: Once | ORAL | 0 refills | Status: AC

## 2023-01-22 NOTE — Progress Notes (Signed)
BP 130/82 (BP Location: Left Arm, Patient Position: Sitting, Cuff Size: Normal)   Pulse 73   Temp 97.8 F (36.6 C) (Oral)   Ht 5' 10.9" (1.801 m)   Wt 247 lb 9.6 oz (112.3 kg)   SpO2 98%   BMI 34.63 kg/m    Subjective:    Patient ID: Eric Mclaughlin, male    DOB: 05-20-1961, 62 y.o.   MRN: [id number]  HPI: Eric Mclaughlin is a 62 y.o. male  Chief Complaint  Patient presents with   Hypertension   HYPERTENSION / HYPERLIPIDEMIA Continues on Valsartan 80 MG daily.  No current cholesterol lowering medications. Satisfied with current treatment? yes Duration of hypertension: chronic BP monitoring frequency: a few times a week BP range: average 128/79 ( 118/73 to 140/87) and HR 70 range BP medication side effects: no Duration of hyperlipidemia: chronic Cholesterol supplements: none - taking different supplements Aspirin: no Recent stressors: no Recurrent headaches: no Visual changes: no Palpitations: no Dyspnea: no Chest pain: no Lower extremity edema: no Dizzy/lightheaded: no  The 10-year ASCVD risk score (Arnett DK, et al., 2019) is: 9.6%   Values used to calculate the score:     Age: 75 years     Sex: Male     Is Non-Hispanic African American: No     Diabetic: No     Tobacco smoker: No     Systolic Blood Pressure: 130 mmHg     Is BP treated: Yes     HDL Cholesterol: 54 mg/dL     Total Cholesterol: 179 mg/dL   Relevant past medical, surgical, family and social history reviewed and updated as indicated. Interim medical history since our last visit reviewed. Allergies and medications reviewed and updated.  Review of Systems  Constitutional:  Negative for activity change, diaphoresis, fatigue and fever.  Respiratory:  Negative for cough, chest tightness, shortness of breath and wheezing.   Cardiovascular:  Negative for chest pain, palpitations and leg swelling.  Gastrointestinal: Negative.   Neurological: Negative.     Per HPI unless specifically indicated  above     Objective:    BP 130/82 (BP Location: Left Arm, Patient Position: Sitting, Cuff Size: Normal)   Pulse 73   Temp 97.8 F (36.6 C) (Oral)   Ht 5' 10.9" (1.801 m)   Wt 247 lb 9.6 oz (112.3 kg)   SpO2 98%   BMI 34.63 kg/m   Wt Readings from Last 3 Encounters:  01/22/23 247 lb 9.6 oz (112.3 kg)  07/05/22 246 lb 8 oz (111.8 kg)  06/25/22 245 lb 11.2 oz (111.4 kg)    Physical Exam Vitals and nursing note reviewed.  Constitutional:      General: He is awake. He is not in acute distress.    Appearance: He is well-developed and well-groomed. He is obese. He is not ill-appearing or toxic-appearing.  HENT:     Head: Normocephalic.  Eyes:     General: Lids are normal.     Extraocular Movements: Extraocular movements intact.     Conjunctiva/sclera: Conjunctivae normal.  Neck:     Thyroid: No thyromegaly.     Vascular: No carotid bruit.  Cardiovascular:     Rate and Rhythm: Normal rate and regular rhythm.     Heart sounds: Normal heart sounds.  Pulmonary:     Effort: No accessory muscle usage or respiratory distress.     Breath sounds: Normal breath sounds.  Abdominal:     General: Bowel sounds are normal. There is  no distension.     Palpations: Abdomen is soft.     Tenderness: There is no abdominal tenderness.  Musculoskeletal:     Cervical back: Full passive range of motion without pain.     Right lower leg: No edema.     Left lower leg: No edema.  Lymphadenopathy:     Cervical: No cervical adenopathy.  Skin:    General: Skin is warm.     Capillary Refill: Capillary refill takes less than 2 seconds.  Neurological:     Mental Status: He is alert and oriented to person, place, and time.     Deep Tendon Reflexes: Reflexes are normal and symmetric.     Reflex Scores:      Brachioradialis reflexes are 2+ on the right side and 2+ on the left side.      Patellar reflexes are 2+ on the right side and 2+ on the left side. Psychiatric:        Attention and Perception:  Attention normal.        Mood and Affect: Mood normal.        Speech: Speech normal.        Behavior: Behavior normal. Behavior is cooperative.        Thought Content: Thought content normal.     Results for orders placed or performed in visit on 08/20/22  Lipid Panel w/o Chol/HDL Ratio  Result Value Ref Range   Cholesterol, Total 179 100 - 199 mg/dL   Triglycerides 782 0 - 149 mg/dL   HDL 54 >95 mg/dL   VLDL Cholesterol Cal 22 5 - 40 mg/dL   LDL Chol Calc (NIH) 621 (H) 0 - 99 mg/dL  Basic metabolic panel  Result Value Ref Range   Glucose 99 70 - 99 mg/dL   BUN 11 8 - 27 mg/dL   Creatinine, Ser 3.08 0.76 - 1.27 mg/dL   eGFR 92 >65 HQ/ION/6.29   BUN/Creatinine Ratio 12 10 - 24   Sodium 142 134 - 144 mmol/L   Potassium 4.4 3.5 - 5.2 mmol/L   Chloride 105 96 - 106 mmol/L   CO2 22 20 - 29 mmol/L   Calcium 9.4 8.6 - 10.2 mg/dL      Assessment & Plan:   Problem List Items Addressed This Visit       Cardiovascular and Mediastinum   Essential hypertension - Primary    Chronic, stable.  BP at goal in office today.  Continue Valsartan 80 MG daily and adjust further as needed, educated him on this medication and use + side effects to monitor for.  Recommend she monitor BP at least a few mornings a week at home and document.  DASH diet at home.  Labs today: BMP and lipid.  Avoid Ibuprofen use and cut back on any alcohol intake.  Return in 6 months.       Relevant Orders   Basic metabolic panel     Other   Elevated low density lipoprotein (LDL) cholesterol level    Recent LDL remains slightly elevated and ASCVD 9.6% -- he prefers not to start medication.  Will recheck today and recommend continue supplements at home.  Is fasting.      Relevant Orders   Lipid Panel w/o Chol/HDL Ratio   Other Visit Diagnoses     Colon cancer screening       Referral to GI after discussion with patient.   Relevant Orders   Ambulatory referral to Gastroenterology  Follow up  plan: Return in about 6 months (around 07/25/2023) for Annual physical.

## 2023-01-22 NOTE — Assessment & Plan Note (Signed)
Recent LDL remains slightly elevated and ASCVD 9.6% -- he prefers not to start medication.  Will recheck today and recommend continue supplements at home.  Is fasting.

## 2023-01-22 NOTE — Telephone Encounter (Signed)
Gastroenterology Pre-Procedure Review  Request Date: 02/08/23 Requesting Physician: Dr. Allegra Lai  PATIENT REVIEW QUESTIONS: The patient responded to the following health history questions as indicated:    1. Are you having any GI issues? no 2. Do you have a personal history of Polyps? no 3. Do you have a family history of Colon Cancer or Polyps? no 4. Diabetes Mellitus? no 5. Joint replacements in the past 12 months?no 6. Major health problems in the past 3 months?no 7. Any artificial heart valves, MVP, or defibrillator?no 8. Cardiac issues? Yes PVC, tachycardia Cardiac Clearance sent to Leanora Ivanoff at [facility]    MEDICATIONS & ALLERGIES:    Patient reports the following regarding taking any anticoagulation/antiplatelet therapy:   Plavix, Coumadin, Eliquis, Xarelto, Lovenox, Pradaxa, Brilinta, or Effient? no Aspirin? no  Patient confirms/reports the following medications:  Current Outpatient Medications  Medication Sig Dispense Refill   Na Sulfate-K Sulfate-Mg Sulf 17.5-3.13-1.6 GM/177ML SOLN Take 1 kit by mouth once for 1 dose. 354 mL 0   valsartan (DIOVAN) 80 MG tablet Take 1 tablet (80 mg total) by mouth daily. 90 tablet 4   No current facility-administered medications for this visit.    Patient confirms/reports the following allergies:  Allergies  Allergen Reactions   Wasp Venom    Amoxicillin Rash    No orders of the defined types were placed in this encounter.   AUTHORIZATION INFORMATION Primary Insurance: 1D#: Group #:  Secondary Insurance: 1D#: Group #:  SCHEDULE INFORMATION: Date: 02/08/23 Time: Location: [facility]

## 2023-01-22 NOTE — Assessment & Plan Note (Signed)
Chronic, stable.  BP at goal in office today.  Continue Valsartan 80 MG daily and adjust further as needed, educated him on this medication and use + side effects to monitor for.  Recommend she monitor BP at least a few mornings a week at home and document.  DASH diet at home.  Labs today: BMP and lipid.  Avoid Ibuprofen use and cut back on any alcohol intake.  Return in 6 months.

## 2023-01-23 NOTE — Progress Notes (Signed)
Contacted via MyChart   Good morning Eric Mclaughlin, your labs have returned: - Kidney function, creatinine and eGFR, remains normal and electrolytes look good. - Your cholesterol levels remain with an elevated LDL, slight trend up.  I do recommend starting low dose cholesterol medication, like Rosuvastatin 10 MG, to help lower these and lower risk for stroke or heart attack, as your ASCVD score we discussed is still elevated. The 10-year ASCVD risk score (Arnett DK, et al., 2019) is: 10.5%   Values used to calculate the score:     Age: 62 years     Sex: Male     Is Non-Hispanic African American: No     Diabetic: No     Tobacco smoker: No     Systolic Blood Pressure: 130 mmHg     Is BP treated: Yes     HDL Cholesterol: 54 mg/dL     Total Cholesterol: 198 mg/dL Any questions?  Would you like to trial medication?  Let me know. Keep being stellar!!  Thank you for allowing me to participate in your care.  I appreciate you. Kindest regards, Yisel Megill

## 2023-01-29 ENCOUNTER — Telehealth: Payer: Self-pay

## 2023-01-29 NOTE — Telephone Encounter (Signed)
Cardiac Clearance has been granted for patient's colonoscopy scheduled with Dr. Allegra Lai on 02/08/23 per Dr. [facility] completed medical clearance received by fax on 01/23/23.  Thanks, [place], [place]

## 2023-02-07 ENCOUNTER — Encounter: Payer: Self-pay | Admitting: Gastroenterology

## 2023-02-08 ENCOUNTER — Ambulatory Visit
Admission: RE | Admit: 2023-02-08 | Discharge: 2023-02-08 | Disposition: A | Discharge status: Home / Self Care | Payer: Managed Care, Other (non HMO) | Attending: Gastroenterology | Admitting: Gastroenterology

## 2023-02-08 ENCOUNTER — Ambulatory Visit: Payer: Managed Care, Other (non HMO) | Admitting: Anesthesiology

## 2023-02-08 ENCOUNTER — Encounter
Admission: RE | Disposition: A | Discharge status: Home / Self Care | Payer: Self-pay | Source: Home / Self Care | Attending: Gastroenterology

## 2023-02-08 DIAGNOSIS — D125 Benign neoplasm of sigmoid colon: Secondary | ICD-10-CM

## 2023-02-08 DIAGNOSIS — Z1211 Encounter for screening for malignant neoplasm of colon: Secondary | ICD-10-CM

## 2023-02-08 DIAGNOSIS — Z7722 Contact with and (suspected) exposure to environmental tobacco smoke (acute) (chronic): Secondary | ICD-10-CM | POA: Diagnosis not present

## 2023-02-08 DIAGNOSIS — K635 Polyp of colon: Secondary | ICD-10-CM

## 2023-02-08 HISTORY — PX: POLYPECTOMY: SHX5525

## 2023-02-08 HISTORY — PX: COLONOSCOPY WITH PROPOFOL: SHX5780

## 2023-02-08 SURGERY — COLONOSCOPY WITH PROPOFOL
Anesthesia: General

## 2023-02-08 MED ORDER — PROPOFOL 10 MG/ML IV BOLUS
INTRAVENOUS | Status: AC
  Filled 2023-02-08: qty 40

## 2023-02-08 MED ORDER — LIDOCAINE HCL (PF) 2 % IJ SOLN
INTRAMUSCULAR | Status: AC
  Filled 2023-02-08: qty 5

## 2023-02-08 MED ORDER — SODIUM CHLORIDE 0.9 % IV SOLN
INTRAVENOUS | Status: DC
  Administered 2023-02-08: 20 mL/h via INTRAVENOUS

## 2023-02-08 MED ORDER — PROPOFOL 10 MG/ML IV BOLUS
INTRAVENOUS | Status: DC | PRN
  Administered 2023-02-08: 100 mg via INTRAVENOUS
  Administered 2023-02-08: 50 mg via INTRAVENOUS
  Administered 2023-02-08: 100 ug/kg/min via INTRAVENOUS

## 2023-02-08 MED ORDER — STERILE WATER FOR IRRIGATION IR SOLN
Status: DC | PRN
  Administered 2023-02-08: 60 mL

## 2023-02-08 MED ORDER — LIDOCAINE HCL (CARDIAC) PF 100 MG/5ML IV SOSY
PREFILLED_SYRINGE | INTRAVENOUS | Status: DC | PRN
  Administered 2023-02-08: 50 mg via INTRAVENOUS

## 2023-02-08 NOTE — Anesthesia Preprocedure Evaluation (Signed)
Anesthesia Evaluation  Patient identified by MRN, date of birth, ID band Patient awake    Reviewed: Allergy & Precautions, NPO status , Patient's Chart, lab work & pertinent test results  History of Anesthesia Complications Negative for: history of anesthetic complications  Airway Mallampati: III  TM Distance: >3 FB Neck ROM: full    Dental no notable dental hx.    Pulmonary sleep apnea    Pulmonary exam normal        Cardiovascular hypertension, On Medications Normal cardiovascular exam     Neuro/Psych  PSYCHIATRIC DISORDERS Anxiety     negative neurological ROS     GI/Hepatic Neg liver ROS,GERD  Controlled,,  Endo/Other  negative endocrine ROS    Renal/GU negative Renal ROS  negative genitourinary   Musculoskeletal   Abdominal   Peds  Hematology negative hematology ROS (+)   Anesthesia Other Findings Past Medical History: No date: Allergy No date: Anxiety No date: GERD (gastroesophageal reflux disease) 07/04/2017: Known health problems: none No date: Known health problems: none No date: Oxygen deficiency No date: Sleep apnea  Past Surgical History: No date: FRACTURE SURGERY 1994: KNEE ARTHROSCOPY; Left     Reproductive/Obstetrics negative OB ROS                             Anesthesia Physical Anesthesia Plan  ASA: 2  Anesthesia Plan: General   Post-op Pain Management: Minimal or no pain anticipated   Induction: Intravenous  PONV Risk Score and Plan: 1 and Propofol infusion and TIVA  Airway Management Planned: Natural Airway and Nasal Cannula  Additional Equipment:   Intra-op Plan:   Post-operative Plan:   Informed Consent: I have reviewed the patients History and Physical, chart, labs and discussed the procedure including the risks, benefits and alternatives for the proposed anesthesia with the patient or authorized representative who has indicated his/her  understanding and acceptance.     Dental Advisory Given  Plan Discussed with: Anesthesiologist, CRNA and Surgeon  Anesthesia Plan Comments: (Patient consented for risks of anesthesia including but not limited to:  - adverse reactions to medications - risk of airway placement if required - damage to eyes, teeth, lips or other oral mucosa - nerve damage due to positioning  - sore throat or hoarseness - Damage to heart, brain, nerves, lungs, other parts of body or loss of life  Patient voiced understanding.)        Anesthesia Quick Evaluation

## 2023-02-08 NOTE — Op Note (Signed)
[facility] Gastroenterology Patient Name: Eric Mclaughlin Procedure Date: 02/08/2023 10:05 AM MRN: [id number] Account #: [id number] Date of Birth: 1960/07/11 Admit Type: Outpatient Age: 62 Room: [facility] ENDO ROOM 1 Gender: Male Note Status: Finalized Instrument Name: Prentice Docker [id number] Procedure:             Colonoscopy Indications:           Screening for colorectal malignant neoplasm, This is                         the patient's first colonoscopy Providers:             Toney Reil MD, MD Referring MD:          Dorie Rank. Harvest Dark (Referring MD) Medicines:             General Anesthesia Complications:         No immediate complications. Estimated blood loss: None. Procedure:             Pre-Anesthesia Assessment:                        - Prior to the procedure, a History and Physical was                         performed, and patient medications and allergies were                         reviewed. The patient is competent. The risks and                         benefits of the procedure and the sedation options and                         risks were discussed with the patient. All questions                         were answered and informed consent was obtained.                         Patient identification and proposed procedure were                         verified by the physician, the nurse, the                         anesthesiologist, the anesthetist and the technician                         in the pre-procedure area in the procedure room in the                         endoscopy suite. Mental Status Examination: alert and                         oriented. Airway Examination: normal oropharyngeal                         airway and neck mobility. Respiratory Examination:  clear to auscultation. CV Examination: normal.                         Prophylactic Antibiotics: The patient does not require                         prophylactic  antibiotics. Prior Anticoagulants: The                         patient has taken no anticoagulant or antiplatelet                         agents. ASA Grade Assessment: II - A patient with mild                         systemic disease. After reviewing the risks and                         benefits, the patient was deemed in satisfactory                         condition to undergo the procedure. The anesthesia                         plan was to use general anesthesia. Immediately prior                         to administration of medications, the patient was                         re-assessed for adequacy to receive sedatives. The                         heart rate, respiratory rate, oxygen saturations,                         blood pressure, adequacy of pulmonary ventilation, and                         response to care were monitored throughout the                         procedure. The physical status of the patient was                         re-assessed after the procedure.                        After obtaining informed consent, the colonoscope was                         passed under direct vision. Throughout the procedure,                         the patient's blood pressure, pulse, and oxygen                         saturations were monitored continuously. The  Colonoscope was introduced through the anus and                         advanced to the the terminal ileum, with                         identification of the appendiceal orifice and IC                         valve. The colonoscopy was performed without                         difficulty. The patient tolerated the procedure well.                         The quality of the bowel preparation was evaluated                         using the BBPS [facility] Bowel Preparation Scale) with                         scores of: Right Colon = 3, Transverse Colon = 3 and                         Left Colon = 3 (entire  mucosa seen well with no                         residual staining, small fragments of stool or opaque                         liquid). The total BBPS score equals 9. The terminal                         ileum, ileocecal valve, appendiceal orifice, and                         rectum were photographed. Findings:      The perianal and digital rectal examinations were normal. Pertinent       negatives include normal sphincter tone and no palpable rectal lesions.      The terminal ileum appeared normal.      A 5 mm polyp was found in the sigmoid colon. The polyp was sessile. The       polyp was removed with a cold snare. Resection and retrieval were       complete.      The retroflexed view of the distal rectum and anal verge was normal and       showed no anal or rectal abnormalities. Impression:            - The examined portion of the ileum was normal.                        - One 5 mm polyp in the sigmoid colon, removed with a                         cold snare. Resected and retrieved.                        -  The distal rectum and anal verge are normal on                         retroflexion view. Recommendation:        - Discharge patient to home (with escort).                        - Resume previous diet today.                        - Continue present medications.                        - Await pathology results.                        - Repeat colonoscopy in 5-10 years for surveillance. Procedure Code(s):     --- Professional ---                        [phone], Colonoscopy, flexible; with removal of                         tumor(s), polyp(s), or other lesion(s) by snare                         technique Diagnosis Code(s):     --- Professional ---                        Z12.11, Encounter for screening for malignant neoplasm                         of colon                        D12.5, Benign neoplasm of sigmoid colon CPT copyright 2022 American Medical Association. All rights  reserved. The codes documented in this report are preliminary and upon coder review may  be revised to meet current compliance requirements. Dr. Libby Maw Toney Reil MD, MD 02/08/2023 10:25:04 AM This report has been signed electronically. Number of Addenda: 0 Note Initiated On: 02/08/2023 10:05 AM Scope Withdrawal Time: 0 hours 9 minutes 3 seconds  Total Procedure Duration: 0 hours 13 minutes 37 seconds  Estimated Blood Loss:  Estimated blood loss: none.      [facility]

## 2023-02-08 NOTE — Anesthesia Postprocedure Evaluation (Signed)
Anesthesia Post Note  Patient: GUHAN CHIZMAR  Procedure(s) Performed: COLONOSCOPY WITH PROPOFOL POLYPECTOMY  Patient location during evaluation: Endoscopy Anesthesia Type: General Level of consciousness: awake and alert Pain management: pain level controlled Vital Signs Assessment: post-procedure vital signs reviewed and stable Respiratory status: spontaneous breathing, nonlabored ventilation, respiratory function stable and patient connected to nasal cannula oxygen Cardiovascular status: blood pressure returned to baseline and stable Postop Assessment: no apparent nausea or vomiting Anesthetic complications: no   No notable events documented.   Last Vitals:  Vitals:   02/08/23 1028 02/08/23 1038  BP: 97/62 112/88  Pulse: 71 64  Resp: 16 14  Temp: (!) 36.3 C   SpO2: 99% 99%    Last Pain:  Vitals:   02/08/23 1038  TempSrc:   PainSc: 0-No pain                 Louie Boston

## 2023-02-08 NOTE — H&P (Signed)
Arlyss Repress, MD [address]  [address]  [place], [place] [address]  Main: [phone]  Fax: [phone] Pager: [phone]  Primary Care Physician:  Marjie Skiff, NP Primary Gastroenterologist:  Dr. Arlyss Repress  Pre-Procedure History & Physical: HPI:  Eric Mclaughlin is a 62 y.o. male is here for a colonoscopy.   Past Medical History:  Diagnosis Date   Allergy    Anxiety    GERD (gastroesophageal reflux disease)    Known health problems: none 07/04/2017   Known health problems: none    Oxygen deficiency    Sleep apnea     Past Surgical History:  Procedure Laterality Date   FRACTURE SURGERY     KNEE ARTHROSCOPY Left 1994    Prior to Admission medications   Medication Sig Start Date End Date Taking? Authorizing Provider  valsartan (DIOVAN) 80 MG tablet Take 1 tablet (80 mg total) by mouth daily. 07/23/22   Aura Dials T, NP    Allergies as of 01/22/2023 - Review Complete 01/22/2023  Allergen Reaction Noted   Wasp venom  04/15/2017   Amoxicillin Rash 04/12/2021    Family History  Problem Relation Age of Onset   Scoliosis Mother    Heart disease Father    Stroke Father     Social History   Socioeconomic History   Marital status: Married    Spouse name: Not on file   Number of children: Not on file   Years of education: Not on file   Highest education level: Not on file  Occupational History   Not on file  Tobacco Use   Smoking status: Never    Passive exposure: Past   Smokeless tobacco: Never  Vaping Use   Vaping status: Never Used  Substance and Sexual Activity   Alcohol use: Not Currently    Alcohol/week: 16.0 standard drinks of alcohol    Types: 14 Cans of beer, 2 Shots of liquor per week    Comment: occ   Drug use: Never   Sexual activity: Not Currently  Other Topics Concern   Not on file  Social History Narrative   Not on file   Social Determinants of Health   Financial Resource Strain: Low Risk  (05/28/2022)    Overall Financial Resource Strain (CARDIA)    Difficulty of Paying Living Expenses: Not hard at all  Food Insecurity: No Food Insecurity (05/28/2022)   Hunger Vital Sign    Worried About Running Out of Food in the Last Year: Never true    Ran Out of Food in the Last Year: Never true  Transportation Needs: No Transportation Needs (05/28/2022)   PRAPARE - Administrator, Civil Service (Medical): No    Lack of Transportation (Non-Medical): No  Physical Activity: Sufficiently Active (05/28/2022)   Exercise Vital Sign    Days of Exercise per Week: 5 days    Minutes of Exercise per Session: 30 min  Stress: No Stress Concern Present (05/28/2022)   [employer] of Occupational Health - Occupational Stress Questionnaire    Feeling of Stress : Not at all  Social Connections: Moderately Isolated (05/28/2022)   Social Connection and Isolation Panel [NHANES]    Frequency of Communication with Friends and Family: More than three times a week    Frequency of Social Gatherings with Friends and Family: More than three times a week    Attends Religious Services: Never    Database administrator or Organizations: No  Attends Banker Meetings: Never    Marital Status: Married  Catering manager Violence: Not At Risk (05/28/2022)   Humiliation, Afraid, Rape, and Kick questionnaire    Fear of Current or Ex-Partner: No    Emotionally Abused: No    Physically Abused: No    Sexually Abused: No    Review of Systems: See HPI, otherwise negative ROS  Physical Exam: BP (!) 164/110   Pulse 88   Temp (!) 96.4 F (35.8 C) (Temporal)   Resp 20   Ht 5\' 11"  (1.803 m)   Wt 110.1 kg   SpO2 100%   BMI 33.86 kg/m  General:   Alert,  pleasant and cooperative in NAD Head:  Normocephalic and atraumatic. Neck:  Supple; no masses or thyromegaly. Lungs:  Clear throughout to auscultation.    Heart:  Regular rate and rhythm. Abdomen:  Soft, nontender and nondistended. Normal bowel  sounds, without guarding, and without rebound.   Neurologic:  Alert and  oriented x4;  grossly normal neurologically.  Impression/Plan: Eric Mclaughlin is here for a colonoscopy to be performed for colon cancer screening  Risks, benefits, limitations, and alternatives regarding  colonoscopy have been reviewed with the patient.  Questions have been answered.  All parties agreeable.   Lannette Donath, MD  02/08/2023, 9:59 AM

## 2023-02-08 NOTE — Transfer of Care (Signed)
Immediate Anesthesia Transfer of Care Note  Patient: Eric Mclaughlin  Procedure(s) Performed: COLONOSCOPY WITH PROPOFOL POLYPECTOMY  Patient Location: PACU  Anesthesia Type:MAC  Level of Consciousness: awake  Airway & Oxygen Therapy: Patient Spontanous Breathing  Post-op Assessment: Report given to RN and Post -op Vital signs reviewed and stable  Post vital signs: Reviewed and stable  Last Vitals:  Vitals Value Taken Time  BP    Temp    Pulse    Resp 16 02/08/23 1028  SpO2      Last Pain:  Vitals:   02/08/23 1028  TempSrc: (P) Temporal  PainSc: (P) 0-No pain         Complications: No notable events documented.

## 2023-02-11 ENCOUNTER — Encounter: Payer: Self-pay | Admitting: Gastroenterology

## 2023-02-12 ENCOUNTER — Encounter: Payer: Self-pay | Admitting: Gastroenterology

## 2023-07-21 NOTE — Patient Instructions (Signed)
 Be Involved in Caring For Your Health:  Taking Medications When medications are taken as directed, they can greatly improve your health. But if they are not taken as prescribed, they may not work. In some cases, not taking them correctly can be harmful. To help ensure your treatment remains effective and safe, understand your medications and how to take them. Bring your medications to each visit for review by your provider.  Your lab results, notes, and after visit summary will be available on My Chart. We strongly encourage you to use this feature. If lab results are abnormal the clinic will contact you with the appropriate steps. If the clinic does not contact you assume the results are satisfactory. You can always view your results on My Chart. If you have questions regarding your health or results, please contact the clinic during office hours. You can also ask questions on My Chart.  We at [facility] are grateful that you chose [place] to provide your care. We strive to provide evidence-based and compassionate care and are always looking for feedback. If you get a survey from the clinic please complete this so we can hear your opinions.  Heart-Healthy Eating Plan Many factors influence your heart health, including eating and exercise habits. Heart health is also called coronary health. Coronary risk increases with abnormal blood fat (lipid) levels. A heart-healthy eating plan includes limiting unhealthy fats, increasing healthy fats, limiting salt (sodium) intake, and making other diet and lifestyle changes. What is my plan? Your health care provider may recommend that: You limit your fat intake to _________% or less of your total calories each day. You limit your saturated fat intake to _________% or less of your total calories each day. You limit the amount of cholesterol in your diet to less than _________ mg per day. You limit the amount of sodium in your diet to less than _________  mg per day. What are tips for following this plan? Cooking Cook foods using methods other than frying. Baking, boiling, grilling, and broiling are all good options. Other ways to reduce fat include: Removing the skin from poultry. Removing all visible fats from meats. Steaming vegetables in water or broth. Meal planning  At meals, imagine dividing your plate into fourths: Fill one-half of your plate with vegetables and green salads. Fill one-fourth of your plate with whole grains. Fill one-fourth of your plate with lean protein foods. Eat 2-4 cups of vegetables per day. One cup of vegetables equals 1 cup (91 g) broccoli or cauliflower florets, 2 medium carrots, 1 large bell pepper, 1 large sweet potato, 1 large tomato, 1 medium white potato, 2 cups (150 g) raw leafy greens. Eat 1-2 cups of fruit per day. One cup of fruit equals 1 small apple, 1 large banana, 1 cup (237 g) mixed fruit, 1 large orange,  cup (82 g) dried fruit, 1 cup (240 mL) 100% fruit juice. Eat more foods that contain soluble fiber. Examples include apples, broccoli, carrots, beans, peas, and barley. Aim to get 25-30 g of fiber per day. Increase your consumption of legumes, nuts, and seeds to 4-5 servings per week. One serving of dried beans or legumes equals  cup (90 g) cooked, 1 serving of nuts is  oz (12 almonds, 24 pistachios, or 7 walnut halves), and 1 serving of seeds equals  oz (8 g). Fats Choose healthy fats more often. Choose monounsaturated and polyunsaturated fats, such as olive and canola oils, avocado oil, flaxseeds, walnuts, almonds, and seeds. Eat  more omega-3 fats. Choose salmon, mackerel, sardines, tuna, flaxseed oil, and ground flaxseeds. Aim to eat fish at least 2 times each week. Check food labels carefully to identify foods with trans fats or high amounts of saturated fat. Limit saturated fats. These are found in animal products, such as meats, butter, and cream. Plant sources of saturated fats  include palm oil, palm kernel oil, and coconut oil. Avoid foods with partially hydrogenated oils in them. These contain trans fats. Examples are stick margarine, some tub margarines, cookies, crackers, and other baked goods. Avoid fried foods. General information Eat more home-cooked food and less restaurant, buffet, and fast food. Limit or avoid alcohol. Limit foods that are high in added sugar and simple starches such as foods made using white refined flour (white breads, pastries, sweets). Lose weight if you are overweight. Losing just 5-10% of your body weight can help your overall health and prevent diseases such as diabetes and heart disease. Monitor your sodium intake, especially if you have high blood pressure. Talk with your health care provider about your sodium intake. Try to incorporate more vegetarian meals weekly. What foods should I eat? Fruits All fresh, canned (in natural juice), or frozen fruits. Vegetables Fresh or frozen vegetables (raw, steamed, roasted, or grilled). Green salads. Grains Most grains. Choose whole wheat and whole grains most of the time. Rice and pasta, including brown rice and pastas made with whole wheat. Meats and other proteins Lean, well-trimmed beef, veal, pork, and lamb. Chicken and [place] without skin. All fish and shellfish. Wild duck, rabbit, pheasant, and venison. Egg whites or low-cholesterol egg substitutes. Dried beans, peas, lentils, and tofu. Seeds and most nuts. Dairy Low-fat or nonfat cheeses, including ricotta and mozzarella. Skim or 1% milk (liquid, powdered, or evaporated). Buttermilk made with low-fat milk. Nonfat or low-fat yogurt. Fats and oils Non-hydrogenated (trans-free) margarines. Vegetable oils, including soybean, sesame, sunflower, olive, avocado, peanut, safflower, corn, canola, and cottonseed. Salad dressings or mayonnaise made with a vegetable oil. Beverages Water (mineral or sparkling). Coffee and tea. Unsweetened ice  tea. Diet beverages. Sweets and desserts Sherbet, gelatin, and fruit ice. Small amounts of dark chocolate. Limit all sweets and desserts. Seasonings and condiments All seasonings and condiments. The items listed above may not be a complete list of foods and beverages you can eat. Contact a dietitian for more options. What foods should I avoid? Fruits Canned fruit in heavy syrup. Fruit in cream or butter sauce. Fried fruit. Limit coconut. Vegetables Vegetables cooked in cheese, cream, or butter sauce. Fried vegetables. Grains Breads made with saturated or trans fats, oils, or whole milk. Croissants. Sweet rolls. Donuts. High-fat crackers, such as cheese crackers and chips. Meats and other proteins Fatty meats, such as hot dogs, ribs, sausage, bacon, rib-eye roast or steak. High-fat deli meats, such as salami and bologna. Caviar. Domestic duck and goose. Organ meats, such as liver. Dairy Cream, sour cream, cream cheese, and creamed cottage cheese. Whole-milk cheeses. Whole or 2% milk (liquid, evaporated, or condensed). Whole buttermilk. Cream sauce or high-fat cheese sauce. Whole-milk yogurt. Fats and oils Meat fat, or shortening. Cocoa butter, hydrogenated oils, palm oil, coconut oil, palm kernel oil. Solid fats and shortenings, including bacon fat, salt pork, lard, and butter. Nondairy cream substitutes. Salad dressings with cheese or sour cream. Beverages Regular sodas and any drinks with added sugar. Sweets and desserts Frosting. Pudding. Cookies. Cakes. Pies. Milk chocolate or white chocolate. Buttered syrups. Full-fat ice cream or ice cream drinks. The items listed above may  not be a complete list of foods and beverages to avoid. Contact a dietitian for more information. Summary Heart-healthy meal planning includes limiting unhealthy fats, increasing healthy fats, limiting salt (sodium) intake and making other diet and lifestyle changes. Lose weight if you are overweight. Losing just  5-10% of your body weight can help your overall health and prevent diseases such as diabetes and heart disease. Focus on eating a balance of foods, including fruits and vegetables, low-fat or nonfat dairy, lean protein, nuts and legumes, whole grains, and heart-healthy oils and fats. This information is not intended to replace advice given to you by your health care provider. Make sure you discuss any questions you have with your health care provider. Document Revised: 07/10/2021 Document Reviewed: 07/10/2021 [employer] Patient Education  2024 [employer].

## 2023-07-25 ENCOUNTER — Ambulatory Visit: Payer: Managed Care, Other (non HMO) | Admitting: Nurse Practitioner

## 2023-07-25 ENCOUNTER — Encounter: Payer: Self-pay | Admitting: Nurse Practitioner

## 2023-07-25 VITALS — BP 125/78 | HR 69 | Temp 97.8°F | Ht 69.5 in | Wt 247.8 lb

## 2023-07-25 DIAGNOSIS — E7800 Pure hypercholesterolemia, unspecified: Secondary | ICD-10-CM

## 2023-07-25 DIAGNOSIS — E559 Vitamin D deficiency, unspecified: Secondary | ICD-10-CM | POA: Diagnosis not present

## 2023-07-25 DIAGNOSIS — I493 Ventricular premature depolarization: Secondary | ICD-10-CM | POA: Diagnosis not present

## 2023-07-25 DIAGNOSIS — R21 Rash and other nonspecific skin eruption: Secondary | ICD-10-CM | POA: Diagnosis not present

## 2023-07-25 DIAGNOSIS — N400 Enlarged prostate without lower urinary tract symptoms: Secondary | ICD-10-CM

## 2023-07-25 DIAGNOSIS — Z0000 Encounter for general adult medical examination without abnormal findings: Secondary | ICD-10-CM | POA: Diagnosis not present

## 2023-07-25 DIAGNOSIS — E6609 Other obesity due to excess calories: Secondary | ICD-10-CM

## 2023-07-25 DIAGNOSIS — I10 Essential (primary) hypertension: Secondary | ICD-10-CM

## 2023-07-25 DIAGNOSIS — Z6834 Body mass index (BMI) 34.0-34.9, adult: Secondary | ICD-10-CM

## 2023-07-25 DIAGNOSIS — E66811 Obesity, class 1: Secondary | ICD-10-CM

## 2023-07-25 LAB — MICROALBUMIN, URINE WAIVED
Creatinine, Urine Waived: 50 mg/dL (ref 10–300)
Microalb, Ur Waived: 10 mg/L (ref 0–19)

## 2023-07-25 MED ORDER — TRIAMCINOLONE ACETONIDE 0.1 % EX CREA: 1.0000 | TOPICAL_CREAM | Freq: Two times a day (BID) | CUTANEOUS | 1 refills | Status: AC

## 2023-07-25 MED ORDER — VALSARTAN 80 MG PO TABS: 80.0000 mg | ORAL_TABLET | Freq: Every day | ORAL | 4 refills | Status: AC

## 2023-07-25 NOTE — Assessment & Plan Note (Addendum)
 Recent LDL remains slightly elevated and ASCVD 9.8% -- he prefers not to start medication.  Will recheck today and recommend continue supplements at home.  Is fasting.  Discussed at length risks and discussed starting Rosuvastatin 10 MG if ongoing elevations.

## 2023-07-25 NOTE — Progress Notes (Addendum)
 BP 125/78   Pulse 69   Temp 97.8 F (36.6 C) (Oral)   Ht 5' 9.5 (1.765 m)   Wt 247 lb 12.8 oz (112.4 kg)   SpO2 98%   BMI 36.07 kg/m    Subjective:    Patient ID: Eric Mclaughlin, male    DOB: [birth year], 63 y.o.   MRN: [id number]  HPI: Eric Mclaughlin is a 63 y.o. male presenting on 07/25/2023 for comprehensive medical examination. Current medical complaints include:none  He currently lives with: wife Interim Problems from his last visit: no  HYPERTENSION / HYPERLIPIDEMIA Continues on Valsartan  80 MG daily.  Continues Vitamin D  at home for history of low levels.  Last saw cardiology 05/22/22 for PVCs.  Satisfied with current treatment? yes Duration of hypertension: chronic BP monitoring frequency: a few times a week BP range: 127/80 range average BP medication side effects: no Duration of hyperlipidemia: chronic Aspirin: no Recent stressors: no Recurrent headaches: no Visual changes: no Palpitations: no Dyspnea: no Chest pain: no Lower extremity edema: no Dizzy/lightheaded: no  The 10-year ASCVD risk score (Arnett DK, et al., 2019) is: 9.8%   Values used to calculate the score:     Age: 17 years     Sex: Male     Is Non-Hispanic African American: No     Diabetic: No     Tobacco smoker: No     Systolic Blood Pressure: 125 mmHg     Is BP treated: Yes     HDL Cholesterol: 54 mg/dL     Total Cholesterol: 198 mg/dL  Functional Status Survey: Is the patient deaf or have difficulty hearing?: No Does the patient have difficulty seeing, even when wearing glasses/contacts?: No Does the patient have difficulty concentrating, remembering, or making decisions?: No Does the patient have difficulty walking or climbing stairs?: No Does the patient have difficulty dressing or bathing?: No Does the patient have difficulty doing errands alone such as visiting a doctor's office or shopping?: No  FALL RISK:    07/25/2023    9:39 AM 01/22/2023    9:08 AM 07/05/2022   10:38 AM  06/25/2022    2:40 PM 05/28/2022    2:07 PM  Fall Risk   Falls in the past year? 0 0 0 0 0  Number falls in past yr: 0 0 0 0 0  Injury with Fall? 0 0 0 0 0  Risk for fall due to : No Fall Risks No Fall Risks No Fall Risks No Fall Risks No Fall Risks  Follow up Falls evaluation completed Falls evaluation completed Falls evaluation completed Falls evaluation completed Falls prevention discussed    Depression Screen    07/25/2023    9:39 AM 01/22/2023    9:09 AM 07/05/2022   10:38 AM 06/25/2022    2:41 PM 05/28/2022    2:54 PM  Depression screen PHQ 2/9  Decreased Interest 0 0 0 0 0  Down, Depressed, Hopeless 0 0 0 0 0  PHQ - 2 Score 0 0 0 0 0  Altered sleeping 0 0 0 0 0  Tired, decreased energy 0 0 0 0 0  Change in appetite 0 0 0 0 0  Feeling bad or failure about yourself  0 0 0 0 0  Trouble concentrating 0 0 0 0 0  Moving slowly or fidgety/restless 0 0 0 0 0  Suicidal thoughts 0 0 0 0 0  PHQ-9 Score 0 0 0 0 0  Difficult doing work/chores Not  difficult at all Not difficult at all Not difficult at all Not difficult at all Not difficult at all      07/25/2023    9:40 AM 01/22/2023    9:09 AM 07/05/2022   10:38 AM 06/25/2022    2:41 PM  GAD 7 : Generalized Anxiety Score  Nervous, Anxious, on Edge 0 0 0 0  Control/stop worrying 0 0 0 0  Worry too much - different things 0 0 0 0  Trouble relaxing 0 0 0 0  Restless 0 0 0 0  Easily annoyed or irritable 0 0 0 0  Afraid - awful might happen 0 0 0 0  Total GAD 7 Score 0 0 0 0  Anxiety Difficulty Not difficult at all Not difficult at all Not difficult at all Not difficult at all   Past Medical History:  Past Medical History:  Diagnosis Date   Allergy    Anxiety    GERD (gastroesophageal reflux disease)    Known health problems: none 07/04/2017   Known health problems: none    Oxygen deficiency    Sleep apnea     Surgical History:  Past Surgical History:  Procedure Laterality Date   COLONOSCOPY WITH PROPOFOL  N/A 02/08/2023    Procedure: COLONOSCOPY WITH PROPOFOL ;  Surgeon: Unk Corinn Skiff, MD;  Location: [facility] ENDOSCOPY;  Service: Gastroenterology;  Laterality: N/A;   FRACTURE SURGERY     KNEE ARTHROSCOPY Left 1994   POLYPECTOMY  02/08/2023   Procedure: POLYPECTOMY;  Surgeon: Unk Corinn Skiff, MD;  Location: [facility] ENDOSCOPY;  Service: Gastroenterology;;    Medications:  No current outpatient medications on file prior to visit.   No current facility-administered medications on file prior to visit.    Allergies:  Allergies  Allergen Reactions   Wasp Venom    Amoxicillin  Rash    Social History:  Social History   Socioeconomic History   Marital status: Married    Spouse name: Not on file   Number of children: Not on file   Years of education: Not on file   Highest education level: Not on file  Occupational History   Not on file  Tobacco Use   Smoking status: Never    Passive exposure: Past   Smokeless tobacco: Never  Vaping Use   Vaping status: Never Used  Substance and Sexual Activity   Alcohol use: Not Currently    Alcohol/week: 16.0 standard drinks of alcohol    Types: 14 Cans of beer, 2 Shots of liquor per week    Comment: occ   Drug use: Never   Sexual activity: Not Currently  Other Topics Concern   Not on file  Social History Narrative   Not on file   Social Drivers of Health   Financial Resource Strain: Low Risk  (07/25/2023)   Overall Financial Resource Strain (CARDIA)    Difficulty of Paying Living Expenses: Not hard at all  Food Insecurity: No Food Insecurity (07/25/2023)   Hunger Vital Sign    Worried About Running Out of Food in the Last Year: Never true    Ran Out of Food in the Last Year: Never true  Transportation Needs: No Transportation Needs (07/25/2023)   PRAPARE - Administrator, Civil Service (Medical): No    Lack of Transportation (Non-Medical): No  Physical Activity: Sufficiently Active (07/25/2023)   Exercise Vital Sign    Days of Exercise per  Week: 5 days    Minutes of Exercise per Session: 60 min  Stress: No Stress Concern Present (07/25/2023)   [employer] of Occupational Health - Occupational Stress Questionnaire    Feeling of Stress : Not at all  Social Connections: Moderately Integrated (07/25/2023)   Social Connection and Isolation Panel [NHANES]    Frequency of Communication with Friends and Family: More than three times a week    Frequency of Social Gatherings with Friends and Family: More than three times a week    Attends Religious Services: Never    Database Administrator or Organizations: Yes    Attends Engineer, Structural: More than 4 times per year    Marital Status: Married  Catering Manager Violence: Not At Risk (07/25/2023)   Humiliation, Afraid, Rape, and Kick questionnaire    Fear of Current or Ex-Partner: No    Emotionally Abused: No    Physically Abused: No    Sexually Abused: No   Social History   Tobacco Use  Smoking Status Never   Passive exposure: Past  Smokeless Tobacco Never   Social History   Substance and Sexual Activity  Alcohol Use Not Currently   Alcohol/week: 16.0 standard drinks of alcohol   Types: 14 Cans of beer, 2 Shots of liquor per week   Comment: occ    Family History:  Family History  Problem Relation Age of Onset   Scoliosis Mother    Heart disease Father    Stroke Father     Past medical history, surgical history, medications, allergies, family history and social history reviewed with patient today and changes made to appropriate areas of the chart.   ROS All other ROS negative except what is listed above and in the HPI.      Objective:    BP 125/78   Pulse 69   Temp 97.8 F (36.6 C) (Oral)   Ht 5' 9.5 (1.765 m)   Wt 247 lb 12.8 oz (112.4 kg)   SpO2 98%   BMI 36.07 kg/m   Wt Readings from Last 3 Encounters:  07/25/23 247 lb 12.8 oz (112.4 kg)  02/08/23 242 lb 12.8 oz (110.1 kg)  01/22/23 247 lb 9.6 oz (112.3 kg)   Physical Exam Vitals  and nursing note reviewed.  Constitutional:      General: He is awake. He is not in acute distress.    Appearance: He is well-developed and well-groomed. He is not ill-appearing or toxic-appearing.  HENT:     Head: Normocephalic and atraumatic.     Right Ear: Hearing, tympanic membrane, ear canal and external ear normal. No drainage.     Left Ear: Hearing, tympanic membrane, ear canal and external ear normal. No drainage.     Nose: Nose normal.     Mouth/Throat:     Pharynx: Uvula midline.  Eyes:     General: Lids are normal.        Right eye: No discharge.        Left eye: No discharge.     Extraocular Movements: Extraocular movements intact.     Conjunctiva/sclera: Conjunctivae normal.     Pupils: Pupils are equal, round, and reactive to light.     Visual Fields: Right eye visual fields normal and left eye visual fields normal.  Neck:     Thyroid : No thyromegaly.     Vascular: No carotid bruit or JVD.     Trachea: Trachea normal.  Cardiovascular:     Rate and Rhythm: Normal rate and regular rhythm.     Heart sounds: Normal heart  sounds, S1 normal and S2 normal. No murmur heard.    No gallop.  Pulmonary:     Effort: Pulmonary effort is normal. No accessory muscle usage or respiratory distress.     Breath sounds: Normal breath sounds.  Abdominal:     General: Bowel sounds are normal.     Palpations: Abdomen is soft. There is no hepatomegaly or splenomegaly.     Tenderness: There is no abdominal tenderness.  Musculoskeletal:        General: Normal range of motion.     Cervical back: Normal range of motion and neck supple.     Right lower leg: No edema.     Left lower leg: No edema.  Lymphadenopathy:     Head:     Right side of head: No submental, submandibular, tonsillar, preauricular or posterior auricular adenopathy.     Left side of head: No submental, submandibular, tonsillar, preauricular or posterior auricular adenopathy.     Cervical: No cervical adenopathy.  Skin:     General: Skin is warm and dry.     Capillary Refill: Capillary refill takes less than 2 seconds.     Findings: Rash present. Rash is papular.     Comments: To bilateral arms and hands, no blisters.    Neurological:     Mental Status: He is alert and oriented to person, place, and time.     Gait: Gait is intact.     Deep Tendon Reflexes: Reflexes are normal and symmetric.     Reflex Scores:      Brachioradialis reflexes are 2+ on the right side and 2+ on the left side.      Patellar reflexes are 2+ on the right side and 2+ on the left side. Psychiatric:        Attention and Perception: Attention normal.        Mood and Affect: Mood normal.        Speech: Speech normal.        Behavior: Behavior normal. Behavior is cooperative.        Thought Content: Thought content normal.        Cognition and Memory: Cognition normal.     Results for orders placed or performed in visit on 01/22/23  Basic metabolic panel   Collection Time: 01/22/23  9:47 AM  Result Value Ref Range   Glucose 89 70 - 99 mg/dL   BUN 11 8 - 27 mg/dL   Creatinine, Ser 8.93 0.76 - 1.27 mg/dL   eGFR 80 >40 fO/fpw/8.26   BUN/Creatinine Ratio 10 10 - 24   Sodium 138 134 - 144 mmol/L   Potassium 4.3 3.5 - 5.2 mmol/L   Chloride 101 96 - 106 mmol/L   CO2 21 20 - 29 mmol/L   Calcium 9.6 8.6 - 10.2 mg/dL  Lipid Panel w/o Chol/HDL Ratio   Collection Time: 01/22/23  9:47 AM  Result Value Ref Range   Cholesterol, Total 198 100 - 199 mg/dL   Triglycerides 865 0 - 149 mg/dL   HDL 54 >60 mg/dL   VLDL Cholesterol Cal 24 5 - 40 mg/dL   LDL Chol Calc (NIH) 879 (H) 0 - 99 mg/dL      Assessment & Plan:   Problem List Items Addressed This Visit       Cardiovascular and Mediastinum   Essential hypertension - Primary   Chronic, stable.  BP at goal in office today.  Continue Valsartan  80 MG daily and adjust further as needed,  educated him on this medication and use + side effects to monitor for.  Recommend he monitor BP at  least a few mornings a week at home and document.  DASH diet at home.  Labs today: CBC, CMP, TS, urine ALB.  Avoid Ibuprofen use and cut back on any alcohol intake.  Return in 6 months.       Relevant Medications   valsartan  (DIOVAN ) 80 MG tablet   Other Relevant Orders   CBC with Differential/Platelet   Comprehensive metabolic panel   TSH   Microalbumin, Urine Waived   Premature ventricular contractions   Chronic, stable.  Followed by cardiology, continue this collaboration, recent notes reviewed.      Relevant Medications   valsartan  (DIOVAN ) 80 MG tablet     Other   Elevated low density lipoprotein (LDL) cholesterol level   Recent LDL remains slightly elevated and ASCVD 9.8% -- he prefers not to start medication.  Will recheck today and recommend continue supplements at home.  Is fasting.  Discussed at length risks and discussed starting Rosuvastatin 10 MG if ongoing elevations.      Relevant Orders   Comprehensive metabolic panel   Lipid Panel w/o Chol/HDL Ratio   Obesity   BMI 63.92.  Recommended eating smaller high protein, low fat meals more frequently and exercising 30 mins a day 5 times a week with a goal of 10-15lb weight loss in the next 3 months. Patient voiced their understanding and motivation to adhere to these recommendations.       Vitamin D  deficiency   Chronic.  History of low levels, with supplement taken.  Check today and adjust as needed.      Relevant Orders   VITAMIN D  25 Hydroxy (Vit-D Deficiency, Fractures)   Other Visit Diagnoses       Benign prostatic hyperplasia without lower urinary tract symptoms       PSA on labs today   Relevant Orders   PSA     Encounter for annual physical exam       Annual physical today with labs and health maintenance reviewed, discussed with patient.       Discussed aspirin prophylaxis for myocardial infarction prevention and decision was it was not indicated  LABORATORY TESTING:  Health maintenance labs  ordered today as discussed above.   The natural history of prostate cancer and ongoing controversy regarding screening and potential treatment outcomes of prostate cancer has been discussed with the patient. The meaning of a false positive PSA and a false negative PSA has been discussed. He indicates understanding of the limitations of this screening test and wishes to proceed with screening PSA testing.  IMMUNIZATIONS:   - Tetanus vaccination status reviewed: last tetanus booster within 10 years. - Influenza: Refused - Pneumovax: Not applicable - Prevnar: Not applicable - Zostavax vaccine: Refused  SCREENING: - Colonoscopy: Up to date  Discussed with patient purpose of the colonoscopy is to detect colon cancer at curable precancerous or early stages   - AAA Screening: Not applicable  -Hearing Test: Not applicable  -Spirometry: Not applicable   PATIENT COUNSELING:    Sexuality: Discussed sexually transmitted diseases, partner selection, use of condoms, avoidance of unintended pregnancy  and contraceptive alternatives.   Advised to avoid cigarette smoking.  I discussed with the patient that most people either abstain from alcohol or drink within safe limits (<=14/week and <=4 drinks/occasion for males, <=7/weeks and <= 3 drinks/occasion for females) and that the risk for alcohol disorders and other  health effects rises proportionally with the number of drinks per week and how often a drinker exceeds daily limits.  Discussed cessation/primary prevention of drug use and availability of treatment for abuse.   Diet: Encouraged to adjust caloric intake to maintain  or achieve ideal body weight, to reduce intake of dietary saturated fat and total fat, to limit sodium intake by avoiding high sodium foods and not adding table salt, and to maintain adequate dietary potassium and calcium preferably from fresh fruits, vegetables, and low-fat dairy products.    Stressed the importance of regular  exercise  Injury prevention: Discussed safety belts, safety helmets, smoke detector, smoking near bedding or upholstery.   Dental health: Discussed importance of regular tooth brushing, flossing, and dental visits.   Follow up plan: NEXT PREVENTATIVE PHYSICAL DUE IN 1 YEAR. Return in about 6 months (around 01/22/2024) for HTN/HLD.

## 2023-07-25 NOTE — Assessment & Plan Note (Signed)
Chronic, stable.  Followed by cardiology, continue this collaboration, recent notes reviewed. 

## 2023-07-25 NOTE — Assessment & Plan Note (Signed)
 Chronic, stable.  BP at goal in office today.  Continue Valsartan  80 MG daily and adjust further as needed, educated him on this medication and use + side effects to monitor for.  Recommend he monitor BP at least a few mornings a week at home and document.  DASH diet at home.  Labs today: CBC, CMP, TS, urine ALB.  Avoid Ibuprofen use and cut back on any alcohol intake.  Return in 6 months.

## 2023-07-25 NOTE — Assessment & Plan Note (Signed)
 Chronic.  History of low levels, with supplement taken.  Check today and adjust as needed.

## 2023-07-25 NOTE — Assessment & Plan Note (Signed)
BMI 36.07.  Recommended eating smaller high protein, low fat meals more frequently and exercising 30 mins a day 5 times a week with a goal of 10-15lb weight loss in the next 3 months. Patient voiced their understanding and motivation to adhere to these recommendations.

## 2023-07-26 ENCOUNTER — Encounter: Payer: Self-pay | Admitting: Nurse Practitioner

## 2023-07-26 LAB — CBC WITH DIFFERENTIAL/PLATELET
Basophils Absolute: 0.1 10*3/uL (ref 0.0–0.2)
Basos: 1 %
EOS (ABSOLUTE): 0.1 10*3/uL (ref 0.0–0.4)
Eos: 3 %
Hematocrit: 47.5 % (ref 37.5–51.0)
Hemoglobin: 16.4 g/dL (ref 13.0–17.7)
Immature Grans (Abs): 0 10*3/uL (ref 0.0–0.1)
Immature Granulocytes: 0 %
Lymphocytes Absolute: 1.1 10*3/uL (ref 0.7–3.1)
Lymphs: 30 %
MCH: 33.7 pg — ABNORMAL HIGH (ref 26.6–33.0)
MCHC: 34.5 g/dL (ref 31.5–35.7)
MCV: 98 fL — ABNORMAL HIGH (ref 79–97)
Monocytes Absolute: 0.3 10*3/uL (ref 0.1–0.9)
Monocytes: 8 %
Neutrophils Absolute: 2.1 10*3/uL (ref 1.4–7.0)
Neutrophils: 58 %
Platelets: 257 10*3/uL (ref 150–450)
RBC: 4.86 x10E6/uL (ref 4.14–5.80)
RDW: 11.6 % (ref 11.6–15.4)
WBC: 3.5 10*3/uL (ref 3.4–10.8)

## 2023-07-26 LAB — COMPREHENSIVE METABOLIC PANEL
ALT: 24 [IU]/L (ref 0–44)
AST: 23 [IU]/L (ref 0–40)
Albumin: 4.7 g/dL (ref 3.9–4.9)
Alkaline Phosphatase: 116 [IU]/L (ref 44–121)
BUN/Creatinine Ratio: 11 (ref 10–24)
BUN: 10 mg/dL (ref 8–27)
Bilirubin Total: 1.1 mg/dL (ref 0.0–1.2)
CO2: 21 mmol/L (ref 20–29)
Calcium: 9.8 mg/dL (ref 8.6–10.2)
Chloride: 103 mmol/L (ref 96–106)
Creatinine, Ser: 0.91 mg/dL (ref 0.76–1.27)
Globulin, Total: 2.5 g/dL (ref 1.5–4.5)
Glucose: 100 mg/dL — ABNORMAL HIGH (ref 70–99)
Potassium: 4.5 mmol/L (ref 3.5–5.2)
Sodium: 139 mmol/L (ref 134–144)
Total Protein: 7.2 g/dL (ref 6.0–8.5)
eGFR: 96 mL/min/{1.73_m2} (ref >59)

## 2023-07-26 LAB — PSA: Prostate Specific Ag, Serum: 0.8 ng/mL (ref 0.0–4.0)

## 2023-07-26 LAB — VITAMIN D 25 HYDROXY (VIT D DEFICIENCY, FRACTURES): Vit D, 25-Hydroxy: 42.6 ng/mL (ref 30.0–100.0)

## 2023-07-26 LAB — LIPID PANEL W/O CHOL/HDL RATIO
Cholesterol, Total: 175 mg/dL (ref 100–199)
HDL: 56 mg/dL (ref >39)
LDL Chol Calc (NIH): 104 mg/dL — ABNORMAL HIGH (ref 0–99)
Triglycerides: 83 mg/dL (ref 0–149)
VLDL Cholesterol Cal: 15 mg/dL (ref 5–40)

## 2023-07-26 LAB — TSH: TSH: 0.802 u[IU]/mL (ref 0.450–4.500)

## 2023-07-26 NOTE — Progress Notes (Signed)
 Contacted via MyChart    Good afternoon Eric Mclaughlin, your labs have returned: - CBC shows no anemia or infection. - Kidney function, creatinine and eGFR, remains normal, as is liver function, AST and ALT.  - Prostate and thyroid  labs are normal - LDL (bad cholesterol) has trended down on this check which places your calculated risk score just a tad above 7% where medication is recommended.  I will place this below. We have a couple options, you can continue diet/exercise focus OR we could start a very low dose statin.  What would you like to try? The 10-year ASCVD risk score (Arnett DK, et al., 2019) is: 8.6%   Values used to calculate the score:     Age: 63 years     Sex: Male     Is Non-Hispanic African American: No     Diabetic: No     Tobacco smoker: No     Systolic Blood Pressure: 125 mmHg     Is BP treated: Yes     HDL Cholesterol: 56 mg/dL     Total Cholesterol: 175 mg/dL  Keep being stellar!!  Thank you for allowing me to participate in your care.  I appreciate you. Kindest regards, Zev Blue

## 2024-02-01 DIAGNOSIS — R7301 Impaired fasting glucose: Secondary | ICD-10-CM | POA: Insufficient documentation

## 2024-02-01 NOTE — Patient Instructions (Signed)
 8.Be Involved in Caring For Your Health:  Taking Medications When medications are taken as directed, they can greatly improve your health. But if they are not taken as prescribed, they may not work. In some cases, not taking them correctly can be harmful. To help ensure your treatment remains effective and safe, understand your medications and how to take them. Bring your medications to each visit for review by your provider.  Your lab results, notes, and after visit summary will be available on My Chart. We strongly encourage you to use this feature. If lab results are abnormal the clinic will contact you with the appropriate steps. If the clinic does not contact you assume the results are satisfactory. You can always view your results on My Chart. If you have questions regarding your health or results, please contact the clinic during office hours. You can also ask questions on My Chart.  We at [facility] are grateful that you chose us  to provide your care. We strive to provide evidence-based and compassionate care and are always looking for feedback. If you get a survey from the clinic please complete this so we can hear your opinions. DASH Eating Plan DASH stands for Dietary Approaches to Stop Hypertension. The DASH eating plan is a healthy eating plan that has been shown to: Lower high blood pressure (hypertension). Reduce your risk for type 2 diabetes, heart disease, and stroke. Help with weight loss. What are tips for following this plan? Reading food labels Check food labels for the amount of salt (sodium) per serving. Choose foods with less than 5 percent of the Daily Value (DV) of sodium. In general, foods with less than 300 milligrams (mg) of sodium per serving fit into this eating plan. To find whole grains, look for the word whole as the first word in the ingredient list. Shopping Buy products labeled as low-sodium or no salt added. Buy fresh foods. Avoid canned  foods and pre-made or frozen meals. Cooking Try not to add salt when you cook. Use salt-free seasonings or herbs instead of table salt or sea salt. Check with your health care provider or pharmacist before using salt substitutes. Do not fry foods. Cook foods in healthy ways, such as baking, boiling, grilling, roasting, or broiling. Cook using oils that are good for your heart. These include olive, canola, avocado, soybean, and sunflower oil. Meal planning  Eat a balanced diet. This should include: 4 or more servings of fruits and 4 or more servings of vegetables each day. Try to fill half of your plate with fruits and vegetables. 6-8 servings of whole grains each day. 6 or less servings of lean meat, poultry, or fish each day. 1 oz is 1 serving. A 3 oz (85 g) serving of meat is about the same size as the palm of your hand. One egg is 1 oz (28 g). 2-3 servings of low-fat dairy each day. One serving is 1 cup (237 mL). 1 serving of nuts, seeds, or beans 5 times each week. 2-3 servings of heart-healthy fats. Healthy fats called omega-3 fatty acids are found in foods such as walnuts, flaxseeds, fortified milks, and eggs. These fats are also found in cold-water  fish, such as sardines, salmon, and mackerel. Limit how much you eat of: Canned or prepackaged foods. Food that is high in trans fat, such as fried foods. Food that is high in saturated fat, such as fatty meat. Desserts and other sweets, sugary drinks, and other foods with added sugar. Full-fat dairy  products. Do not salt foods before eating. Do not eat more than 4 egg yolks a week. Try to eat at least 2 vegetarian meals a week. Eat more home-cooked food and less restaurant, buffet, and fast food. Lifestyle When eating at a restaurant, ask if your food can be made with less salt or no salt. If you drink alcohol: Limit how much you have to: 0-1 drink a day if you are male. 0-2 drinks a day if you are male. Know how much alcohol is in  your drink. In the [place] one drink is one 12 oz bottle of beer (355 mL), one 5 oz glass of wine (148 mL), or one 1 oz glass of hard liquor (44 mL). General information Avoid eating more than 2,300 mg of salt a day. If you have hypertension, you may need to reduce your sodium intake to 1,500 mg a day. Work with your provider to stay at a healthy body weight or lose weight. Ask what the best weight range is for you. On most days of the week, get at least 30 minutes of exercise that causes your heart to beat faster. This may include walking, swimming, or biking. Work with your provider or dietitian to adjust your eating plan to meet your specific calorie needs. What foods should I eat? Fruits All fresh, dried, or frozen fruit. Canned fruits that are in their natural juice and do not have sugar added to them. Vegetables Fresh or frozen vegetables that are raw, steamed, roasted, or grilled. Low-sodium or reduced-sodium tomato and vegetable juice. Low-sodium or reduced-sodium tomato sauce and tomato paste. Low-sodium or reduced-sodium canned vegetables. Grains Whole-grain or whole-wheat bread. Whole-grain or whole-wheat pasta. Brown rice. [place]. Bulgur. Whole-grain and low-sodium cereals. Pita bread. Low-fat, low-sodium crackers. Whole-wheat flour tortillas. Meats and other proteins Skinless chicken or [place]. Ground chicken or [place]. Pork with fat trimmed off. Fish and seafood. Egg whites. Dried beans, peas, or lentils. Unsalted nuts, nut butters, and seeds. Unsalted canned beans. Lean cuts of beef with fat trimmed off. Low-sodium, lean precooked or cured meat, such as sausages or meat loaves. Dairy Low-fat (1%) or fat-free (skim) milk. Reduced-fat, low-fat, or fat-free cheeses. Nonfat, low-sodium ricotta or cottage cheese. Low-fat or nonfat yogurt. Low-fat, low-sodium cheese. Fats and oils Soft margarine without trans fats. Vegetable oil. Reduced-fat, low-fat, or light mayonnaise and salad  dressings (reduced-sodium). Canola, safflower, olive, avocado, soybean, and sunflower oils. Avocado. Seasonings and condiments Herbs. Spices. Seasoning mixes without salt. Other foods Unsalted popcorn and pretzels. Fat-free sweets. The items listed above may not be all the foods and drinks you can have. Talk to a dietitian to learn more. What foods should I avoid? Fruits Canned fruit in a light or heavy syrup. Fried fruit. Fruit in cream or butter sauce. Vegetables Creamed or fried vegetables. Vegetables in a cheese sauce. Regular canned vegetables that are not marked as low-sodium or reduced-sodium. Regular canned tomato sauce and paste that are not marked as low-sodium or reduced-sodium. Regular tomato and vegetable juices that are not marked as low-sodium or reduced-sodium. Dene. Olives. Grains Baked goods made with fat, such as croissants, muffins, or some breads. Dry pasta or rice meal packs. Meats and other proteins Fatty cuts of meat. Ribs. Fried meat. [place]. Bologna, salami, and other precooked or cured meats, such as sausages or meat loaves, that are not lean and low in sodium. Fat from the back of a pig (fatback). Bratwurst. Salted nuts and seeds. Canned beans with added salt. Canned or  smoked fish. Whole eggs or egg yolks. Chicken or [place] with skin. Dairy Whole or 2% milk, cream, and half-and-half. Whole or full-fat cream cheese. Whole-fat or sweetened yogurt. Full-fat cheese. Nondairy creamers. Whipped toppings. Processed cheese and cheese spreads. Fats and oils Butter. Stick margarine. Lard. Shortening. Ghee. Bacon fat. Tropical oils, such as coconut, palm kernel, or palm oil. Seasonings and condiments Onion salt, garlic salt, seasoned salt, table salt, and sea salt. Worcestershire sauce. Tartar sauce. Barbecue sauce. Teriyaki sauce. Soy sauce, including reduced-sodium soy sauce. Steak sauce. Canned and packaged gravies. Fish sauce. Oyster sauce. Cocktail sauce. Store-bought  horseradish. Ketchup. Mustard. Meat flavorings and tenderizers. Bouillon cubes. Hot sauces. Pre-made or packaged marinades. Pre-made or packaged taco seasonings. Relishes. Regular salad dressings. Other foods Salted popcorn and pretzels. The items listed above may not be all the foods and drinks you should avoid. Talk to a dietitian to learn more. Where to find more information National Heart, Lung, and Blood Institute (NHLBI): BuffaloDryCleaner.gl American Heart Association (AHA): heart.org Academy of Nutrition and Dietetics: eatright.org National Kidney Foundation (NKF): kidney.org This information is not intended to replace advice given to you by your health care provider. Make sure you discuss any questions you have with your health care provider. Document Revised: 06/21/2022 Document Reviewed: 06/21/2022 Elsevier Patient Education  2024 [employer].

## 2024-02-03 ENCOUNTER — Encounter: Payer: Self-pay | Admitting: Nurse Practitioner

## 2024-02-03 ENCOUNTER — Ambulatory Visit: Payer: Managed Care, Other (non HMO) | Admitting: Nurse Practitioner

## 2024-02-03 VITALS — BP 126/80 | HR 70 | Temp 97.6°F | Ht 69.5 in | Wt 247.4 lb

## 2024-02-03 DIAGNOSIS — E66811 Obesity, class 1: Secondary | ICD-10-CM | POA: Diagnosis not present

## 2024-02-03 DIAGNOSIS — R7301 Impaired fasting glucose: Secondary | ICD-10-CM

## 2024-02-03 DIAGNOSIS — E7800 Pure hypercholesterolemia, unspecified: Secondary | ICD-10-CM | POA: Diagnosis not present

## 2024-02-03 DIAGNOSIS — E6609 Other obesity due to excess calories: Secondary | ICD-10-CM

## 2024-02-03 DIAGNOSIS — I10 Essential (primary) hypertension: Secondary | ICD-10-CM | POA: Diagnosis not present

## 2024-02-03 DIAGNOSIS — Z6834 Body mass index (BMI) 34.0-34.9, adult: Secondary | ICD-10-CM

## 2024-02-03 NOTE — Assessment & Plan Note (Signed)
 Recent LDL remains slightly elevated and ASCVD 9.6% -- he prefers not to start medication.  Will recheck today and recommend continue supplements at home.  Is fasting.  Discussed at length risks and discussed starting Rosuvastatin 10 MG if ongoing elevations.

## 2024-02-03 NOTE — Assessment & Plan Note (Signed)
 Noted on past labs, but last A1c 4.8%.  Recheck today and if remains stable will discontinue this diagnosis for now.

## 2024-02-03 NOTE — Assessment & Plan Note (Signed)
BMI 36.01.  Recommended eating smaller high protein, low fat meals more frequently and exercising 30 mins a day 5 times a week with a goal of 10-15lb weight loss in the next 3 months. Patient voiced their understanding and motivation to adhere to these recommendations.

## 2024-02-03 NOTE — Assessment & Plan Note (Signed)
 Chronic, stable.  BP at goal in office today on recheck and home levels well below goal. Does endorse some white coat symptoms.  Continue Valsartan  80 MG daily and adjust further as needed, educated him on this medication and use + side effects to monitor for.  Recommend he monitor BP at least a few mornings a week at home and document.  DASH diet at home.  Labs today: CMP.  Avoid Ibuprofen use and cut back on any alcohol intake.

## 2024-02-03 NOTE — Progress Notes (Signed)
 BP 126/80 (BP Location: Left Arm, Patient Position: Sitting)   Pulse 70   Temp 97.6 F (36.4 C) (Oral)   Ht 5' 9.5 (1.765 m)   Wt 247 lb 6.4 oz (112.2 kg)   SpO2 97%   BMI 36.01 kg/m    Subjective:    Patient ID: Eric Mclaughlin, male    DOB: 11-22-1960, 63 y.o.   MRN: [id number]  HPI: PAX Eric Mclaughlin is a 63 y.o. male  Chief Complaint  Patient presents with   Hyperlipidemia   Hypertension   HYPERTENSION / HYPERLIPIDEMIA Taking Valsartan  80 MG daily. Took medicine just before coming here today.  Saw cardiology last 05/22/22 for PVCs.  Takes Fish Oil at home, prefers not to take statin. Satisfied with current treatment? yes Duration of hypertension: chronic BP monitoring frequency: a few times a week BP range: 111/69 to 136/76 = on average <130/80 BP medication side effects: no Duration of hyperlipidemia: chronic Aspirin: no Recent stressors: no Recurrent headaches: no Visual changes: no Palpitations: no Dyspnea: no Chest pain: no Lower extremity edema: no Dizzy/lightheaded: occasional at lunch time The 10-year ASCVD risk score (Arnett DK, et al., 2019) is: 9.6%   Values used to calculate the score:     Age: 51 years     Clincally relevant sex: Male     Is Non-Hispanic African American: No     Diabetic: No     Tobacco smoker: No     Systolic Blood Pressure: 126 mmHg     Is BP treated: Yes     HDL Cholesterol: 56 mg/dL     Total Cholesterol: 175 mg/dL  Impaired Fasting Glucose HbA1C:  Lab Results  Component Value Date   HGBA1C 4.8 05/14/2019  Duration of elevated blood sugar: chronic Polydipsia: no Polyuria: no Weight change: no Visual disturbance: no Glucose Monitoring: no    Accucheck frequency: Not Checking    Fasting glucose:     Post prandial:  Diabetic Education: Not Completed Family history of diabetes: no   Relevant past medical, surgical, family and social history reviewed and updated as indicated. Interim medical history since our last  visit reviewed. Allergies and medications reviewed and updated.  Review of Systems  Constitutional:  Negative for activity change, diaphoresis, fatigue and fever.  Respiratory:  Negative for cough, chest tightness, shortness of breath and wheezing.   Cardiovascular:  Negative for chest pain, palpitations and leg swelling.  Gastrointestinal: Negative.   Neurological: Negative.   Psychiatric/Behavioral: Negative.      Per HPI unless specifically indicated above     Objective:    BP 126/80 (BP Location: Left Arm, Patient Position: Sitting)   Pulse 70   Temp 97.6 F (36.4 C) (Oral)   Ht 5' 9.5 (1.765 m)   Wt 247 lb 6.4 oz (112.2 kg)   SpO2 97%   BMI 36.01 kg/m   Wt Readings from Last 3 Encounters:  02/03/24 247 lb 6.4 oz (112.2 kg)  07/25/23 247 lb 12.8 oz (112.4 kg)  02/08/23 242 lb 12.8 oz (110.1 kg)    Physical Exam Vitals and nursing note reviewed.  Constitutional:      General: He is awake. He is not in acute distress.    Appearance: He is well-developed and well-groomed. He is obese. He is not ill-appearing or toxic-appearing.  HENT:     Head: Normocephalic.     Right Ear: Hearing and external ear normal.     Left Ear: Hearing and external ear normal.  Eyes:     General: Lids are normal.     Extraocular Movements: Extraocular movements intact.     Conjunctiva/sclera: Conjunctivae normal.  Neck:     Thyroid : No thyromegaly.     Vascular: No carotid bruit.  Cardiovascular:     Rate and Rhythm: Normal rate and regular rhythm.     Heart sounds: Normal heart sounds. No murmur heard.    No gallop.  Pulmonary:     Effort: No accessory muscle usage or respiratory distress.     Breath sounds: Normal breath sounds.  Abdominal:     General: Bowel sounds are normal. There is no distension.     Palpations: Abdomen is soft.     Tenderness: There is no abdominal tenderness.  Musculoskeletal:     Cervical back: Full passive range of motion without pain.     Right lower  leg: No edema.     Left lower leg: No edema.  Lymphadenopathy:     Cervical: No cervical adenopathy.  Skin:    General: Skin is warm.     Capillary Refill: Capillary refill takes less than 2 seconds.  Neurological:     Mental Status: He is alert and oriented to person, place, and time.     Deep Tendon Reflexes: Reflexes are normal and symmetric.     Reflex Scores:      Brachioradialis reflexes are 2+ on the right side and 2+ on the left side.      Patellar reflexes are 2+ on the right side and 2+ on the left side. Psychiatric:        Attention and Perception: Attention normal.        Mood and Affect: Mood normal.        Speech: Speech normal.        Behavior: Behavior normal. Behavior is cooperative.        Thought Content: Thought content normal.    Results for orders placed or performed in visit on 07/25/23  Microalbumin, Urine Waived   Collection Time: 07/25/23 10:12 AM  Result Value Ref Range   Microalb, Ur Waived 10 0 - 19 mg/L   Creatinine, Urine Waived 50 10 - 300 mg/dL   Microalb/Creat Ratio 30-300 (H) <30 mg/g  CBC with Differential/Platelet   Collection Time: 07/25/23 10:13 AM  Result Value Ref Range   WBC 3.5 3.4 - 10.8 x10E3/uL   RBC 4.86 4.14 - 5.80 x10E6/uL   Hemoglobin 16.4 13.0 - 17.7 g/dL   Hematocrit 52.4 62.4 - 51.0 %   MCV 98 (H) 79 - 97 fL   MCH 33.7 (H) 26.6 - 33.0 pg   MCHC 34.5 31.5 - 35.7 g/dL   RDW 88.3 88.3 - 84.5 %   Platelets 257 150 - 450 x10E3/uL   Neutrophils 58 Not Estab. %   Lymphs 30 Not Estab. %   Monocytes 8 Not Estab. %   Eos 3 Not Estab. %   Basos 1 Not Estab. %   Neutrophils Absolute 2.1 1.4 - 7.0 x10E3/uL   Lymphocytes Absolute 1.1 0.7 - 3.1 x10E3/uL   Monocytes Absolute 0.3 0.1 - 0.9 x10E3/uL   EOS (ABSOLUTE) 0.1 0.0 - 0.4 x10E3/uL   Basophils Absolute 0.1 0.0 - 0.2 x10E3/uL   Immature Granulocytes 0 Not Estab. %   Immature Grans (Abs) 0.0 0.0 - 0.1 x10E3/uL  Comprehensive metabolic panel   Collection Time: 07/25/23 10:13  AM  Result Value Ref Range   Glucose 100 (H) 70 - 99 mg/dL  BUN 10 8 - 27 mg/dL   Creatinine, Ser 9.08 0.76 - 1.27 mg/dL   eGFR 96 >40 fO/fpw/8.26   BUN/Creatinine Ratio 11 10 - 24   Sodium 139 134 - 144 mmol/L   Potassium 4.5 3.5 - 5.2 mmol/L   Chloride 103 96 - 106 mmol/L   CO2 21 20 - 29 mmol/L   Calcium 9.8 8.6 - 10.2 mg/dL   Total Protein 7.2 6.0 - 8.5 g/dL   Albumin 4.7 3.9 - 4.9 g/dL   Globulin, Total 2.5 1.5 - 4.5 g/dL   Bilirubin Total 1.1 0.0 - 1.2 mg/dL   Alkaline Phosphatase 116 44 - 121 IU/L   AST 23 0 - 40 IU/L   ALT 24 0 - 44 IU/L  TSH   Collection Time: 07/25/23 10:13 AM  Result Value Ref Range   TSH 0.802 0.450 - 4.500 uIU/mL  PSA   Collection Time: 07/25/23 10:13 AM  Result Value Ref Range   Prostate Specific Ag, Serum 0.8 0.0 - 4.0 ng/mL  Lipid Panel w/o Chol/HDL Ratio   Collection Time: 07/25/23 10:13 AM  Result Value Ref Range   Cholesterol, Total 175 100 - 199 mg/dL   Triglycerides 83 0 - 149 mg/dL   HDL 56 >60 mg/dL   VLDL Cholesterol Cal 15 5 - 40 mg/dL   LDL Chol Calc (NIH) 895 (H) 0 - 99 mg/dL  VITAMIN D  25 Hydroxy (Vit-D Deficiency, Fractures)   Collection Time: 07/25/23 10:13 AM  Result Value Ref Range   Vit D, 25-Hydroxy 42.6 30.0 - 100.0 ng/mL      Assessment & Plan:   Problem List Items Addressed This Visit       Cardiovascular and Mediastinum   Essential hypertension - Primary   Chronic, stable.  BP at goal in office today on recheck and home levels well below goal. Does endorse some white coat symptoms.  Continue Valsartan  80 MG daily and adjust further as needed, educated him on this medication and use + side effects to monitor for.  Recommend he monitor BP at least a few mornings a week at home and document.  DASH diet at home.  Labs today: CMP.  Avoid Ibuprofen use and cut back on any alcohol intake.        Relevant Orders   Basic metabolic panel with GFR     Endocrine   IFG (impaired fasting glucose)   Noted on past labs,  but last A1c 4.8%.  Recheck today and if remains stable will discontinue this diagnosis for now.      Relevant Orders   HgB A1c     Other   Obesity   BMI 36.01.  Recommended eating smaller high protein, low fat meals more frequently and exercising 30 mins a day 5 times a week with a goal of 10-15lb weight loss in the next 3 months. Patient voiced their understanding and motivation to adhere to these recommendations.       Elevated low density lipoprotein (LDL) cholesterol level   Recent LDL remains slightly elevated and ASCVD 9.6% -- he prefers not to start medication.  Will recheck today and recommend continue supplements at home.  Is fasting.  Discussed at length risks and discussed starting Rosuvastatin 10 MG if ongoing elevations.      Relevant Orders   Lipid Panel w/o Chol/HDL Ratio     Follow up plan: Return in about 6 months (around 08/05/2024) for Annual Physical after 07/24/24.

## 2024-02-04 ENCOUNTER — Ambulatory Visit: Payer: Self-pay | Admitting: Nurse Practitioner

## 2024-02-04 LAB — BASIC METABOLIC PANEL WITH GFR
BUN/Creatinine Ratio: 9 — ABNORMAL LOW (ref 10–24)
BUN: 9 mg/dL (ref 8–27)
CO2: 21 mmol/L (ref 20–29)
Calcium: 9.4 mg/dL (ref 8.6–10.2)
Chloride: 101 mmol/L (ref 96–106)
Creatinine, Ser: 1 mg/dL (ref 0.76–1.27)
Glucose: 96 mg/dL (ref 70–99)
Potassium: 4.2 mmol/L (ref 3.5–5.2)
Sodium: 136 mmol/L (ref 134–144)
eGFR: 85 mL/min/1.73 (ref >59)

## 2024-02-04 LAB — HEMOGLOBIN A1C
Est. average glucose Bld gHb Est-mCnc: 91 mg/dL
Hgb A1c MFr Bld: 4.8 % (ref 4.8–5.6)

## 2024-02-04 LAB — LIPID PANEL W/O CHOL/HDL RATIO
Cholesterol, Total: 170 mg/dL (ref 100–199)
HDL: 49 mg/dL (ref >39)
LDL Chol Calc (NIH): 95 mg/dL (ref 0–99)
Triglycerides: 146 mg/dL (ref 0–149)
VLDL Cholesterol Cal: 26 mg/dL (ref 5–40)

## 2024-02-04 NOTE — Progress Notes (Signed)
 Contacted via MyChart The 10-year ASCVD risk score (Arnett DK, et al., 2019) is: 10.2%   Values used to calculate the score:     Age: 63 years     Clincally relevant sex: Male     Is Non-Hispanic African American: No     Diabetic: No     Tobacco smoker: No     Systolic Blood Pressure: 126 mmHg     Is BP treated: Yes     HDL Cholesterol: 49 mg/dL     Total Cholesterol: 170 mg/dL

## 2024-08-10 ENCOUNTER — Encounter: Admitting: Nurse Practitioner
